# Patient Record
Sex: Male | Born: 1985 | Race: Black or African American | Hispanic: No | Marital: Single | Smoking: Never smoker
Health system: Southern US, Community
[De-identification: ages and names within clinical notes are randomized; demographics above are authoritative.]

## PROBLEM LIST (undated history)

## (undated) DIAGNOSIS — J45909 Unspecified asthma, uncomplicated: Secondary | ICD-10-CM

---
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
## 2009-03-24 ENCOUNTER — Emergency Department (HOSPITAL_COMMUNITY): Admission: EM | Admit: 2009-03-24 | Discharge: 2009-03-24 | Payer: Self-pay | Admitting: Emergency Medicine

## 2009-05-11 ENCOUNTER — Emergency Department (HOSPITAL_COMMUNITY): Admission: EM | Admit: 2009-05-11 | Discharge: 2009-05-11 | Payer: Self-pay | Admitting: Emergency Medicine

## 2009-09-27 ENCOUNTER — Emergency Department (HOSPITAL_COMMUNITY): Admission: EM | Admit: 2009-09-27 | Discharge: 2009-09-27 | Payer: Self-pay | Admitting: Emergency Medicine

## 2010-03-24 ENCOUNTER — Emergency Department (HOSPITAL_COMMUNITY): Admission: EM | Admit: 2010-03-24 | Discharge: 2010-03-24 | Payer: Self-pay | Admitting: Emergency Medicine

## 2010-06-13 ENCOUNTER — Emergency Department (HOSPITAL_COMMUNITY): Admission: EM | Admit: 2010-06-13 | Discharge: 2010-06-13 | Payer: Self-pay | Admitting: Emergency Medicine

## 2010-07-02 ENCOUNTER — Emergency Department (HOSPITAL_COMMUNITY): Admission: EM | Admit: 2010-07-02 | Discharge: 2010-07-02 | Payer: Self-pay | Admitting: Emergency Medicine

## 2010-09-09 ENCOUNTER — Emergency Department (HOSPITAL_COMMUNITY): Admission: EM | Admit: 2010-09-09 | Discharge: 2010-09-09 | Payer: Self-pay | Admitting: Emergency Medicine

## 2010-10-06 ENCOUNTER — Emergency Department (HOSPITAL_COMMUNITY): Admission: EM | Admit: 2010-10-06 | Discharge: 2010-10-06 | Payer: Self-pay | Admitting: Emergency Medicine

## 2010-10-31 ENCOUNTER — Emergency Department (HOSPITAL_COMMUNITY)
Admission: EM | Admit: 2010-10-31 | Discharge: 2010-10-31 | Payer: Self-pay | Source: Home / Self Care | Admitting: Emergency Medicine

## 2010-11-08 ENCOUNTER — Emergency Department (HOSPITAL_COMMUNITY)
Admission: EM | Admit: 2010-11-08 | Discharge: 2010-11-08 | Payer: Self-pay | Source: Home / Self Care | Admitting: Emergency Medicine

## 2010-11-23 ENCOUNTER — Emergency Department (HOSPITAL_COMMUNITY)
Admission: EM | Admit: 2010-11-23 | Discharge: 2010-11-23 | Payer: Self-pay | Source: Home / Self Care | Admitting: Emergency Medicine

## 2011-09-13 ENCOUNTER — Emergency Department (HOSPITAL_COMMUNITY)
Admission: EM | Admit: 2011-09-13 | Discharge: 2011-09-13 | Disposition: A | Discharge status: Home / Self Care | Payer: Self-pay | Attending: Emergency Medicine | Admitting: Emergency Medicine

## 2011-09-13 DIAGNOSIS — Z79899 Other long term (current) drug therapy: Secondary | ICD-10-CM | POA: Insufficient documentation

## 2011-09-13 DIAGNOSIS — M549 Dorsalgia, unspecified: Secondary | ICD-10-CM | POA: Insufficient documentation

## 2012-12-26 ENCOUNTER — Emergency Department (HOSPITAL_COMMUNITY): Payer: Self-pay

## 2012-12-26 ENCOUNTER — Emergency Department (HOSPITAL_COMMUNITY)
Admission: EM | Admit: 2012-12-26 | Discharge: 2012-12-26 | Disposition: A | Discharge status: Home / Self Care | Payer: Self-pay | Attending: Emergency Medicine | Admitting: Emergency Medicine

## 2012-12-26 ENCOUNTER — Encounter (HOSPITAL_COMMUNITY): Payer: Self-pay | Admitting: Emergency Medicine

## 2012-12-26 DIAGNOSIS — J45901 Unspecified asthma with (acute) exacerbation: Secondary | ICD-10-CM | POA: Insufficient documentation

## 2012-12-26 DIAGNOSIS — J45909 Unspecified asthma, uncomplicated: Secondary | ICD-10-CM

## 2012-12-26 HISTORY — DX: Unspecified asthma, uncomplicated: J45.909

## 2012-12-26 MED ORDER — ALBUTEROL SULFATE (5 MG/ML) 0.5% IN NEBU
5.0000 mg | INHALATION_SOLUTION | Freq: Once | RESPIRATORY_TRACT | Status: AC
  Administered 2012-12-26: 5 mg via RESPIRATORY_TRACT
  Filled 2012-12-26: qty 1

## 2012-12-26 MED ORDER — AEROCHAMBER PLUS FLO-VU MEDIUM MISC
1.0000 | Freq: Once | Status: AC
  Administered 2012-12-26: 1
  Filled 2012-12-26 (×2): qty 1

## 2012-12-26 MED ORDER — ALBUTEROL SULFATE HFA 108 (90 BASE) MCG/ACT IN AERS
2.0000 | INHALATION_SPRAY | RESPIRATORY_TRACT | Status: DC | PRN
  Administered 2012-12-26: 2 via RESPIRATORY_TRACT
  Filled 2012-12-26: qty 6.7

## 2012-12-26 NOTE — ED Provider Notes (Signed)
History     CSN: [id number]  Arrival date & time 12/26/12  0917   First MD Initiated Contact with Patient 12/26/12 [phone]      Chief Complaint  Patient presents with  . Shortness of Breath    (Consider location/radiation/quality/duration/timing/severity/associated sxs/prior treatment) HPI Presents with nonproductive cough and wheezing typical of asthma onset 24 hours ago. He was treating himself with albuterol inhaler with partial improvement until he ran out of his inhaler this morning. No fever no other complaint. Symptoms feel like asthma he's had in the past. Past Medical History  Diagnosis Date  . Asthma    Had steroids as child. No recent hospitalization History reviewed. No pertinent past surgical history.  History reviewed. No pertinent family history.  History  Substance Use Topics  . Smoking status: Not on file  . Smokeless tobacco: Not on file  . Alcohol Use: No     nonsmoker no alcohol no drug Review of Systems  Constitutional: Negative.   HENT: Negative.   Respiratory: Positive for cough, shortness of breath and wheezing.   Cardiovascular: Negative.   Gastrointestinal: Negative.   Musculoskeletal: Negative.   Skin: Negative.   Neurological: Negative.   Hematological: Negative.   Psychiatric/Behavioral: Negative.   All other systems reviewed and are negative.    Allergies  Review of patient's allergies indicates not on file.  Home Medications  No current outpatient prescriptions on file.  Pulse 88  Temp 98.5 F (36.9 C) (Oral)  Resp 20  Ht 6\' 6"  (1.981 m)  Wt 170 lb (77.111 kg)  BMI 19.65 kg/m2  SpO2 98%  Physical Exam  Nursing note and vitals reviewed. Constitutional: He appears well-developed and well-nourished.  HENT:  Head: Normocephalic and atraumatic.  Eyes: Conjunctivae normal are normal. Pupils are equal, round, and reactive to light.  Neck: Neck supple. No tracheal deviation present. No thyromegaly present.  Cardiovascular:  Normal rate and regular rhythm.   No murmur heard. Pulmonary/Chest: Effort normal.       Speaks in paragraphs mild end expiratory wheezes no use of accessory muscles  Abdominal: Soft. Bowel sounds are normal. He exhibits no distension. There is no tenderness.  Musculoskeletal: Normal range of motion. He exhibits no edema and no tenderness.  Neurological: He is alert. Coordination normal.  Skin: Skin is warm and dry. No rash noted.  Psychiatric: He has a normal mood and affect.    ED Course  Procedures (including critical care time)  Labs Reviewed - No data to display No results found.   No diagnosis found.  Date: 12/26/2012  Rate: 80  Rhythm: normal sinus rhythm  QRS Axis: normal  Intervals: normal  ST/T Wave abnormalities: early repolarization  Conduction Disutrbances:Rightward IV CD  Narrative Interpretation:   Old EKG Reviewed: none available  No results found for this or any previous visit. Dg Chest 2 View  12/26/2012  *RADIOLOGY REPORT*  Clinical Data: Chest pain and shortness of breath  CHEST - 2 VIEW  Comparison: November 23, 2010  Findings:  Lungs clear.  Heart size and pulmonary vascularity are normal.  No adenopathy.  No bone lesions.  No pneumothorax.  IMPRESSION: No abnormality noted.   Original Report Authenticated By: Bretta Bang, M.D.     Chest x-ray reviewed by me  11:15 AM after one nebulized treatment patient's breathing is normal. He speaks in paragraphs he is no respiratory distress lungs clear auscultation MDM  Plan albuterol inhaler to go with spacer use 2 puffs every 4 hours when necessary  shortness of breath. Referral resource guide to get primary care physician Diagnosis asthmatic bronchitis        Doug Sou, MD 12/26/12 1118

## 2012-12-26 NOTE — ED Notes (Signed)
Pt here for SOB ran out of INHAL sob increased over 2 day with cold weather

## 2013-08-16 ENCOUNTER — Emergency Department: Payer: Self-pay | Admitting: Emergency Medicine

## 2013-09-22 ENCOUNTER — Emergency Department: Payer: Self-pay | Admitting: Emergency Medicine

## 2014-08-03 ENCOUNTER — Emergency Department: Payer: Self-pay | Admitting: Emergency Medicine

## 2015-03-04 ENCOUNTER — Emergency Department (HOSPITAL_COMMUNITY)
Admission: EM | Admit: 2015-03-04 | Discharge: 2015-03-04 | Disposition: A | Discharge status: Home / Self Care | Payer: Self-pay | Attending: Emergency Medicine | Admitting: Emergency Medicine

## 2015-03-04 ENCOUNTER — Encounter (HOSPITAL_COMMUNITY): Payer: Self-pay | Admitting: Emergency Medicine

## 2015-03-04 DIAGNOSIS — R0602 Shortness of breath: Secondary | ICD-10-CM | POA: Insufficient documentation

## 2015-03-04 DIAGNOSIS — R519 Headache, unspecified: Secondary | ICD-10-CM

## 2015-03-04 DIAGNOSIS — R51 Headache: Secondary | ICD-10-CM | POA: Insufficient documentation

## 2015-03-04 DIAGNOSIS — Z791 Long term (current) use of non-steroidal anti-inflammatories (NSAID): Secondary | ICD-10-CM | POA: Insufficient documentation

## 2015-03-04 DIAGNOSIS — Z79899 Other long term (current) drug therapy: Secondary | ICD-10-CM | POA: Insufficient documentation

## 2015-03-04 DIAGNOSIS — Z760 Encounter for issue of repeat prescription: Secondary | ICD-10-CM | POA: Insufficient documentation

## 2015-03-04 DIAGNOSIS — J45901 Unspecified asthma with (acute) exacerbation: Secondary | ICD-10-CM | POA: Insufficient documentation

## 2015-03-04 DIAGNOSIS — R05 Cough: Secondary | ICD-10-CM | POA: Insufficient documentation

## 2015-03-04 MED ORDER — ALBUTEROL SULFATE HFA 108 (90 BASE) MCG/ACT IN AERS: 1.0000 | INHALATION_SPRAY | RESPIRATORY_TRACT | Status: DC | PRN

## 2015-03-04 MED ORDER — PROCHLORPERAZINE MALEATE 10 MG PO TABS
10.0000 mg | ORAL_TABLET | Freq: Once | ORAL | Status: AC
  Administered 2015-03-04: 10 mg via ORAL
  Filled 2015-03-04: qty 1

## 2015-03-04 MED ORDER — PREDNISONE 20 MG PO TABS
60.0000 mg | ORAL_TABLET | Freq: Once | ORAL | Status: AC
  Administered 2015-03-04: 60 mg via ORAL
  Filled 2015-03-04: qty 3

## 2015-03-04 MED ORDER — PREDNISONE 50 MG PO TABS: ORAL_TABLET | ORAL | Status: DC

## 2015-03-04 MED ORDER — BUTALBITAL-APAP-CAFFEINE 50-325-40 MG PO TABS: 1.0000 | ORAL_TABLET | Freq: Four times a day (QID) | ORAL | Status: DC | PRN

## 2015-03-04 MED ORDER — IPRATROPIUM-ALBUTEROL 0.5-2.5 (3) MG/3ML IN SOLN
3.0000 mL | Freq: Once | RESPIRATORY_TRACT | Status: AC
  Administered 2015-03-04: 3 mL via RESPIRATORY_TRACT
  Filled 2015-03-04: qty 3

## 2015-03-04 MED ORDER — ALBUTEROL SULFATE HFA 108 (90 BASE) MCG/ACT IN AERS
2.0000 | INHALATION_SPRAY | Freq: Once | RESPIRATORY_TRACT | Status: AC
  Administered 2015-03-04: 2 via RESPIRATORY_TRACT
  Filled 2015-03-04: qty 6.7

## 2015-03-04 NOTE — ED Notes (Signed)
Pt st's he has a hx of asthma and is out of his inhaler.  Pt c/o cough and shortness of breath onset earlier today.  Pt speaking in full sentences

## 2015-03-04 NOTE — Discharge Instructions (Signed)
Do not hesitate to return to the emergency room for any new, worsening or concerning symptoms.  Please obtain primary care using resource guide below. But the minute you were seen in the emergency room and that they will need to obtain records for further outpatient management.

## 2015-03-04 NOTE — ED Provider Notes (Signed)
CSN: [id number]     Arrival date & time 03/04/15  2003 History  This chart was scribed for non-physician practitioner Wynetta Emery, PA working with Benjiman Core, MD, by Tanda Rockers, ED Scribe. This patient was seen in room TR06C/TR06C and the patient's care was started at 8:38 PM.    Chief Complaint  Patient presents with  . Headache  . Shortness of Breath   The history is provided by the patient. No language interpreter was used.    HPI Comments: Nicholas Braun is a 29 y.o. male with hx asthma who presents to the Emergency Department complaining of headache that began earlier today around 5 PM (3.5 hours ago). Pt states that the pain is generalized. He rates the pain as an 8/10 on the pain scale. Pt states that he has had similar symptoms prior but that it is more severe this time around. Pt took Aleve earlier today with no relief.He mentions that he had nausea and vomiting earlier today prior to the headache beginning. Pt vomited once today but has been eating and drinking normally since.  He also complains of shortness of breath, dry cough, and wheezing. He mentions that his chest is hurting due to the cough. Pt lost his inhaler while moving to the area. He denies ever being hospitalized for his asthma. Pt denies neck pain, fever, or any other symptoms. Pt is not on anti-coagulants at this time.    Past Medical History  Diagnosis Date  . Asthma    History reviewed. No pertinent past surgical history. No family history on file. History  Substance Use Topics  . Smoking status: Never Smoker   . Smokeless tobacco: Not on file  . Alcohol Use: No    Review of Systems  A complete 10 system review of systems was obtained and all systems are negative except as noted in the HPI and PMH.    Allergies  Review of patient's allergies indicates no known allergies.  Home Medications   Prior to Admission medications   Medication Sig Start Date End Date Taking? Authorizing  Provider  albuterol (PROVENTIL HFA;VENTOLIN HFA) 108 (90 BASE) MCG/ACT inhaler Inhale 2 puffs into the lungs every 6 (six) hours as needed.    Historical Provider, MD  naproxen sodium (ANAPROX) 220 MG tablet Take 220 mg by mouth 2 (two) times daily with a meal. pain    Historical Provider, MD   Triage Vitals: BP 114/71 mmHg  Pulse 63  Temp(Src) 98.2 F (36.8 C) (Oral)  Resp 17  Ht  (2.007 m)  Wt 157 lb 9.6 oz (71.487 kg)  BMI 17.75 kg/m2  SpO2 96%   Physical Exam  Constitutional: He is oriented to person, place, and time. He appears well-developed and well-nourished. No distress.  HENT:  Head: Normocephalic and atraumatic.  Mouth/Throat: Oropharynx is clear and moist.  Eyes: Conjunctivae and EOM are normal. Pupils are equal, round, and reactive to light.  Neck: Normal range of motion. Neck supple. No tracheal deviation present.  FROM to C-spine. Pt can touch chin to chest without discomfort. No TTP of midline cervical spine.   Cardiovascular: Normal rate.   Pulmonary/Chest: Effort normal. No stridor. No respiratory distress. He has wheezes. He has no rales. He exhibits no tenderness.  Trace, scattered expiratory wheezing.  Abdominal: Soft.  Musculoskeletal: Normal range of motion.  Neurological: He is alert and oriented to person, place, and time.  II-Visual fields grossly intact. III/IV/VI-Extraocular movements intact.  Pupils reactive bilaterally. V/VII-Smile symmetric, equal  eyebrow raise,  facial sensation intact VIII- Hearing grossly intact IX/X-Normal gag XI-bilateral shoulder shrug XII-midline tongue extension Motor: 5/5 bilaterally with normal tone and bulk Cerebellar: Normal finger-to-nose  and normal heel-to-shin test.   Romberg negative Ambulates with a coordinated gait  Skin: Skin is warm and dry.  Psychiatric: He has a normal mood and affect. His behavior is normal.  Nursing note and vitals reviewed.   ED Course  Procedures (including critical care  time)  DIAGNOSTIC STUDIES: Oxygen Saturation is 96% on RA, normal by my interpretation.    COORDINATION OF CARE: 8:42 PM-Discussed treatment plan which includes albuterol inhaler and nebulizer treatment with pt at bedside and pt agreed to plan.   Labs Review Labs Reviewed - No data to display  Imaging Review No results found.   EKG Interpretation None      MDM   Final diagnoses:  Nonintractable headache, unspecified chronicity pattern, unspecified headache type  Medication refill    Filed Vitals:   03/04/15 2012 03/04/15 2208  BP: 114/71 107/69  Pulse: 63 79  Temp: 98.2 F (36.8 C)   TempSrc: Oral   Resp: 17 16  Height: 6\' 7"  (2.007 m)   Weight: 157 lb 9.6 oz (71.487 kg)   SpO2: 96% 98%    Medications  predniSONE (DELTASONE) tablet 60 mg (60 mg Oral Given 03/04/15 2049)  prochlorperazine (COMPAZINE) tablet 10 mg (10 mg Oral Given 03/04/15 2125)  albuterol (PROVENTIL HFA;VENTOLIN HFA) 108 (90 BASE) MCG/ACT inhaler 2 puff (2 puffs Inhalation Given 03/04/15 2126)  ipratropium-albuterol (DUONEB) 0.5-2.5 (3) MG/3ML nebulizer solution 3 mL (3 mLs Nebulization Given 03/04/15 2050)    Nicholas Braun is a pleasant 29 y.o. male presenting with headache and shortness of breath. Patient recently moved and cannot find his albuterol inhaler. He is saturating well on room air, lung sounds are clear to auscultation, no fever or productive cough to suggest pneumonia. HA. Presentation is like pts typical HA and non concerning for [facility], ICH, Meningitis, or temporal arteritis. Pt is afebrile with no focal neuro deficits, nuchal rigidity, or change in vision. Offered patient IV and headache cocktail and patient declines, he feels improved after Compazine, prednisone and DuoNeb. Patient will be given albuterol inhaler in the ED. Primary care referral and return precautions given.  Evaluation does not show pathology that would require ongoing emergent intervention or inpatient treatment. Pt is  hemodynamically stable and mentating appropriately. Discussed findings and plan with patient/guardian, who agrees with care plan. All questions answered. Return precautions discussed and outpatient follow up given.   New Prescriptions   ALBUTEROL (PROVENTIL HFA;VENTOLIN HFA) 108 (90 BASE) MCG/ACT INHALER    Inhale 1-2 puffs into the lungs every 4 (four) hours as needed for wheezing or shortness of breath.   BUTALBITAL-ACETAMINOPHEN-CAFFEINE (FIORICET) 50-325-40 MG PER TABLET    Take 1 tablet by mouth every 6 (six) hours as needed for headache or migraine.   PREDNISONE (DELTASONE) 50 MG TABLET    Take 1 tablet daily with breakfast     I personally performed the services described in this documentation, which was scribed in my presence. The recorded information has been reviewed and is accurate.      Wynetta Emeryicole Baylea Milburn, PA-C 03/05/15 [id number]  Benjiman CoreNathan Pickering, MD 03/08/15 [phone]

## 2015-06-15 ENCOUNTER — Emergency Department (HOSPITAL_COMMUNITY)
Admission: EM | Admit: 2015-06-15 | Discharge: 2015-06-16 | Disposition: A | Discharge status: Home / Self Care | Payer: Self-pay | Attending: Emergency Medicine | Admitting: Emergency Medicine

## 2015-06-15 ENCOUNTER — Encounter (HOSPITAL_COMMUNITY): Payer: Self-pay | Admitting: *Deleted

## 2015-06-15 DIAGNOSIS — Z791 Long term (current) use of non-steroidal anti-inflammatories (NSAID): Secondary | ICD-10-CM | POA: Insufficient documentation

## 2015-06-15 DIAGNOSIS — J45901 Unspecified asthma with (acute) exacerbation: Secondary | ICD-10-CM | POA: Insufficient documentation

## 2015-06-15 MED ORDER — LORATADINE 10 MG PO TABS: 10.0000 mg | ORAL_TABLET | Freq: Every day | ORAL | Status: DC

## 2015-06-15 MED ORDER — PREDNISONE 20 MG PO TABS
60.0000 mg | ORAL_TABLET | Freq: Once | ORAL | Status: AC
  Administered 2015-06-15: 60 mg via ORAL
  Filled 2015-06-15: qty 3

## 2015-06-15 MED ORDER — PREDNISONE 50 MG PO TABS: ORAL_TABLET | ORAL | Status: DC

## 2015-06-15 MED ORDER — ALBUTEROL SULFATE (2.5 MG/3ML) 0.083% IN NEBU
5.0000 mg | INHALATION_SOLUTION | Freq: Once | RESPIRATORY_TRACT | Status: AC
  Administered 2015-06-15: 5 mg via RESPIRATORY_TRACT
  Filled 2015-06-15: qty 6

## 2015-06-15 MED ORDER — ALBUTEROL SULFATE HFA 108 (90 BASE) MCG/ACT IN AERS
2.0000 | INHALATION_SPRAY | Freq: Once | RESPIRATORY_TRACT | Status: AC
  Administered 2015-06-15: 2 via RESPIRATORY_TRACT
  Filled 2015-06-15: qty 6.7

## 2015-06-15 MED ORDER — IPRATROPIUM BROMIDE 0.02 % IN SOLN
0.5000 mg | Freq: Once | RESPIRATORY_TRACT | Status: AC
  Administered 2015-06-15: 0.5 mg via RESPIRATORY_TRACT
  Filled 2015-06-15: qty 2.5

## 2015-06-15 NOTE — ED Provider Notes (Signed)
CSN: [id number]     Arrival date & time 06/15/15  2203 History  This chart was scribed for non-physician practitioner, Nicholas MaduraKelly Majesty Stehlin, PA-C working with Nicholas MochaBlair Walden, MD by Nicholas Braun, ED scribe. This patient was seen in room WTR9/WTR9 and the patient's care was started at 10:43 PM.   Chief Complaint  Patient presents with  . Shortness of Breath   The history is provided by the patient. No language interpreter was used.   HPI Comments: Nicholas Braun is a 29 y.o. male, with a history of asthma, who presents to the Emergency Department complaining of intermittent, moderate, SOB with onset 3 days ago. He notes that he experiences mild SOB and an associated, intermittent, mild, unproductive cough when waking in the morning that progressively worsens throughout the day due to the heat. He notes taking albuterol for his asthma and benadryl for his chronic seasonal allergies as needed. 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 He denies fever, chills, LOC or any history of smoking.     Past Medical History  Diagnosis Date  . Asthma    History reviewed. No pertinent past surgical history. No family history on file. History  Substance Use Topics  . Smoking status: Never Smoker   . Smokeless tobacco: Not on file  . Alcohol Use: No    Review of Systems  Respiratory: Positive for cough and shortness of breath.   All other systems reviewed and are negative.   Allergies  Review of patient's allergies indicates no known allergies.  Home Medications   Prior to Admission medications   Medication Sig Start Date End Date Taking? Authorizing Provider  naproxen sodium (ANAPROX) 220 MG tablet Take 220 mg by mouth 2 (two) times daily with a meal. pain   Yes Historical Provider, MD  albuterol (PROVENTIL HFA;VENTOLIN HFA) 108 (90 BASE) MCG/ACT inhaler Inhale 1-2 puffs into the lungs every 4 (four) hours as needed for wheezing or shortness of breath. Patient not taking: Reported on 06/15/2015  03/04/15   Joni ReiningNicole Pisciotta, PA-C  butalbital-acetaminophen-caffeine (FIORICET) 50-325-40 MG per tablet Take 1 tablet by mouth every 6 (six) hours as needed for headache or migraine. Patient not taking: Reported on 06/15/2015 03/04/15   Joni ReiningNicole Pisciotta, PA-C  loratadine (CLARITIN) 10 MG tablet Take 1 tablet (10 mg total) by mouth daily. 06/15/15   Nicholas MaduraKelly Gunhild Bautch, PA-C  predniSONE (DELTASONE) 50 MG tablet Take 1 tablet daily with breakfast 06/15/15   Nicholas MaduraKelly Eboney Claybrook, PA-C   BP 106/58 mmHg  Pulse 48  Temp(Src) 98.1 F (36.7 C) (Oral)  Resp 16  SpO2 100%   Physical Exam  Constitutional: He is oriented to person, place, and time. He appears well-developed and well-nourished. No distress.  HENT:  Head: Normocephalic and atraumatic.  Eyes: Conjunctivae and EOM are normal. No scleral icterus.  Neck: Normal range of motion.  Cardiovascular: Normal rate, regular rhythm and intact distal pulses.   Pulmonary/Chest: Effort normal. No respiratory distress. He has wheezes. He has no rales.  Mild inspiratory and expiratory wheezes. No accessory muscle use or retractions.  Musculoskeletal: Normal range of motion.  Neurological: He is alert and oriented to person, place, and time. He exhibits normal muscle tone. Coordination normal.  Skin: Skin is warm and dry. No rash noted. He is not diaphoretic. No erythema. No pallor.  Psychiatric: He has a normal mood and affect. His behavior is normal.  Nursing note and vitals reviewed.   ED Course  Procedures  DIAGNOSTIC STUDIES: Oxygen Saturation is 100% on RA, normal  by my interpretation.    COORDINATION OF CARE: 10:46 PM Discussed treatment plan with pt at bedside and pt agreed to plan.  Labs Review Labs Reviewed - No data to display  Imaging Review No results found.   EKG Interpretation None      MDM   Final diagnoses:  Asthma exacerbation    29 year old male presents to the emergency department for shortness of breath 3 days.Patient reports a  history of asthma and states that he has been out of his albuterol inhaler. Patient with mild inspiratory and expiratory wheezing on exam. This resolved after 1 DuoNeb treatment and oral sterile. Patient is afebrile. No tachypnea, dyspnea, or hypoxia. Doubt pneumonia. Will discharge at this time with albuterol inhaler as well as a course of Claritin and prednisone. Return precautions discussed and provided. Patient agreeable to plan with no unaddressed concerns. Patient discharged in good condition.  I personally performed the services described in this documentation, which was scribed in my presence. The recorded information has been reviewed and is accurate.     Nicholas Madura, PA-C 06/15/15 2348  Nicholas Mocha, MD 06/16/15 0000

## 2015-06-15 NOTE — Discharge Instructions (Signed)

## 2015-06-15 NOTE — ED Notes (Signed)
Pt states he has been having shortness of breath for the past 3 days. Pt states he has a history of asthma.

## 2015-08-08 ENCOUNTER — Encounter (HOSPITAL_COMMUNITY): Payer: Self-pay | Admitting: Family Medicine

## 2015-08-08 ENCOUNTER — Emergency Department (HOSPITAL_COMMUNITY)
Admission: EM | Admit: 2015-08-08 | Discharge: 2015-08-09 | Disposition: A | Discharge status: Home / Self Care | Payer: Self-pay | Attending: Emergency Medicine | Admitting: Emergency Medicine

## 2015-08-08 ENCOUNTER — Emergency Department (HOSPITAL_COMMUNITY): Payer: Self-pay

## 2015-08-08 DIAGNOSIS — J45901 Unspecified asthma with (acute) exacerbation: Secondary | ICD-10-CM | POA: Insufficient documentation

## 2015-08-08 DIAGNOSIS — Z791 Long term (current) use of non-steroidal anti-inflammatories (NSAID): Secondary | ICD-10-CM | POA: Insufficient documentation

## 2015-08-08 LAB — BASIC METABOLIC PANEL
Anion gap: 6 (ref 5–15)
BUN: 8 mg/dL (ref 6–20)
CO2: 28 mmol/L (ref 22–32)
CREATININE: 1.23 mg/dL (ref 0.61–1.24)
Calcium: 8.3 mg/dL — ABNORMAL LOW (ref 8.9–10.3)
Chloride: 107 mmol/L (ref 101–111)
GFR calc Af Amer: >60 mL/min (ref >60)
GLUCOSE: 99 mg/dL (ref 65–99)
POTASSIUM: 3.7 mmol/L (ref 3.5–5.1)
Sodium: 141 mmol/L (ref 135–145)

## 2015-08-08 LAB — CBC
HEMATOCRIT: 41.7 % (ref 39.0–52.0)
Hemoglobin: 13.7 g/dL (ref 13.0–17.0)
MCH: 29.4 pg (ref 26.0–34.0)
MCHC: 32.9 g/dL (ref 30.0–36.0)
MCV: 89.5 fL (ref 78.0–100.0)
Platelets: 224 10*3/uL (ref 150–400)
RBC: 4.66 MIL/uL (ref 4.22–5.81)
RDW: 12.4 % (ref 11.5–15.5)
WBC: 4.9 10*3/uL (ref 4.0–10.5)

## 2015-08-08 LAB — I-STAT TROPONIN, ED: Troponin i, poc: 0 ng/mL (ref 0.00–0.08)

## 2015-08-08 MED ORDER — PREDNISONE 20 MG PO TABS
60.0000 mg | ORAL_TABLET | Freq: Once | ORAL | Status: AC
  Administered 2015-08-08: 60 mg via ORAL
  Filled 2015-08-08: qty 3

## 2015-08-08 MED ORDER — ALBUTEROL SULFATE (2.5 MG/3ML) 0.083% IN NEBU
5.0000 mg | INHALATION_SOLUTION | RESPIRATORY_TRACT | Status: AC
  Administered 2015-08-08: 5 mg via RESPIRATORY_TRACT
  Filled 2015-08-08: qty 6

## 2015-08-08 MED ORDER — IPRATROPIUM BROMIDE 0.02 % IN SOLN
0.5000 mg | Freq: Once | RESPIRATORY_TRACT | Status: AC
  Administered 2015-08-08: 0.5 mg via RESPIRATORY_TRACT
  Filled 2015-08-08: qty 2.5

## 2015-08-08 NOTE — ED Notes (Signed)
NURSE AT BEDSIDE WILL GET BLOOD

## 2015-08-08 NOTE — ED Notes (Signed)
Patients states has been experiencing shortness of breath for the last two days but got worse today. Today, he developed mid-sternal chest pain.

## 2015-08-08 NOTE — ED Provider Notes (Signed)
CSN: [id number]     Arrival date & time 08/08/15  2136 History  This chart was scribed for Derwood Kaplan, MD by Ronney Lion, ED Scribe. This patient was seen in room WA14/WA14 and the patient's care was started at 11:20 PM.    Chief Complaint  Patient presents with  . Shortness of Breath  . Chest Pain   The history is provided by the patient. No language interpreter was used.    HPI Comments: Nicholas Braun is a 29 y.o. male with a history of asthma, who presents to the Emergency Department complaining of moderate, pressure-like chest pain when breathing, with onset 2 days ago and worsening today. He also complains of an associated cough productive with green sputum, SOB, and wheezing. He reports a history of seasonal allergies and states changes in weather sometimes exacerbate his asthma symptoms. Patient states he is not currently followed by a PCP for his asthma due to insurance issues. He states he is otherwise healthy. Patient denies a history of smoking. He denies any nasal congestion or sinus congestion.  Past Medical History  Diagnosis Date  . Asthma    History reviewed. No pertinent past surgical history. History reviewed. No pertinent family history. Social History  Substance Use Topics  . Smoking status: Never Smoker   . Smokeless tobacco: None  . Alcohol Use: No    Review of Systems  HENT: Negative for congestion and sinus pressure.   Respiratory: Positive for shortness of breath.   Cardiovascular: Positive for chest pain (when breathing).  All other systems reviewed and are negative.  Allergies  Review of patient's allergies indicates no known allergies.  Home Medications   Prior to Admission medications   Medication Sig Start Date End Date Taking? Authorizing Provider  albuterol (PROVENTIL HFA;VENTOLIN HFA) 108 (90 BASE) MCG/ACT inhaler Inhale 1-2 puffs into the lungs every 4 (four) hours as needed for wheezing or shortness of breath. Patient not taking:  Reported on 06/15/2015 03/04/15   [name], PA-C  butalbital-acetaminophen-caffeine (FIORICET) 50-325-40 MG per tablet Take 1 tablet by mouth every 6 (six) hours as needed for headache or migraine. Patient not taking: Reported on 06/15/2015 03/04/15   [name], PA-C  loratadine (CLARITIN) 10 MG tablet Take 1 tablet (10 mg total) by mouth daily. Patient not taking: Reported on 08/08/2015 06/15/15   [name], PA-C  naproxen sodium (ANAPROX) 220 MG tablet Take 220 mg by mouth 2 (two) times daily with a meal. pain    Historical Provider, MD  predniSONE (DELTASONE) 50 MG tablet Take 1 tablet daily with breakfast Patient not taking: Reported on 08/08/2015 06/15/15   [name], PA-C   BP 116/68 mmHg  Pulse 54  Temp(Src) 98.5 F (36.9 C) (Oral)  Resp 16  Ht  (2.007 m)  Wt 210 lb (95.255 kg)  BMI 23.65 kg/m2  SpO2 97% Physical Exam  Constitutional: He is oriented to person, place, and time. He appears well-developed and well-nourished. No distress.  HENT:  Head: Normocephalic and atraumatic.  Eyes: Conjunctivae and EOM are normal.  Neck: Neck supple. No JVD present. No tracheal deviation present.  Cardiovascular: Normal rate, regular rhythm and normal heart sounds.   Pulses:      Radial pulses are 2+ on the right side, and 2+ on the left side.  2+ equal radial pulse. No JVD. No pitting edema or unilateral calf swelling.   Pulmonary/Chest: Effort normal. No respiratory distress. He has wheezes.  Diffuse expiratory wheezing  Musculoskeletal: Normal  range of motion. He exhibits no edema.  Lymphadenopathy:    He has no cervical adenopathy.  Neurological: He is alert and oriented to person, place, and time.  Skin: Skin is warm and dry.  Psychiatric: He has a normal mood and affect. His behavior is normal.  Nursing note and vitals reviewed.   ED Course  Procedures (including critical care time)  DIAGNOSTIC STUDIES: Oxygen Saturation is 97% on RA, normal by my  interpretation.    COORDINATION OF CARE: 11:23 PM - Discussed treatment plan with pt at bedside which includes breathing treatment and CXR. Pt verbalized understanding and agreed to plan.   2:22 AM - Lungs cleared. Albuterol inhaler to be given in the ER along with decadron.  Labs Review Labs Reviewed  BASIC METABOLIC PANEL - Abnormal; Notable for the following:    Calcium 8.3 (*)    All other components within normal limits  CBC  I-STAT TROPOININ, ED    Imaging Review Dg Chest 2 View  08/08/2015   CLINICAL DATA:  Shortness of breath for 2 days. Worse today after playing basketball. Now with midsternal chest pain.  EXAM: CHEST  2 VIEW  COMPARISON:  09/22/2013  FINDINGS: Unchanged hyperinflation. The cardiomediastinal contours are normal. The lungs are clear. Pulmonary vasculature is normal. No consolidation, pleural effusion, or pneumothorax. No acute osseous abnormalities are seen.  IMPRESSION: Unchanged hyperinflation, can be seen with reactive airways disease. No localizing or acute process.   Electronically Signed   By: Rubye Oaks M.D.   On: 08/08/2015 23:20   I have personally reviewed and evaluated these images and lab results as part of my medical decision-making.   EKG Interpretation   Date/Time:  Sunday August 08 2015 21:55:24 EDT Ventricular Rate:  57 PR Interval:  180 QRS Duration: 90 QT Interval:  405 QTC Calculation: 394 R Axis:   65 Text Interpretation:  Sinus rhythm RSR' in V1 or V2, probably normal  variant Inferior infarct, acute (LCx) ST elevation, consider anterior  injury No significant change since last tracing Confirmed by Rhunette Croft, MD,  Janey Genta [phone]) on 08/08/2015 11:25:03 PM      MDM   Final diagnoses:  Asthma exacerbation    I personally performed the services described in this documentation, which was scribed in my presence. The recorded information has been reviewed and is accurate.   Pt comes in with cc of dib. He has expiratory  wheezing, end. No treatments at home. We gave him an hour long tx, and his wheezing has cleared. Will d.c    Derwood Kaplan, MD 08/09/15 [phone]

## 2015-08-09 MED ORDER — DEXAMETHASONE SODIUM PHOSPHATE 10 MG/ML IJ SOLN
10.0000 mg | Freq: Once | INTRAMUSCULAR | Status: AC
  Administered 2015-08-09: 10 mg via INTRAMUSCULAR
  Filled 2015-08-09: qty 1

## 2015-08-09 MED ORDER — ALBUTEROL SULFATE HFA 108 (90 BASE) MCG/ACT IN AERS
1.0000 | INHALATION_SPRAY | Freq: Once | RESPIRATORY_TRACT | Status: AC
  Administered 2015-08-09: 1 via RESPIRATORY_TRACT
  Filled 2015-08-09: qty 6.7

## 2015-08-09 NOTE — Discharge Instructions (Signed)
We saw you in the ER for your asthma related complains. We gave you some breathing treatments in the ER, and seems like your symptoms have improved. Please take albuterol as needed every 4 hours. Please take the medications prescribed. Please refrain from smoking or smoke exposure. Please see a primary care doctor in 1 week. Return to the ER if your symptoms worsen.   Bronchospasm A bronchospasm is a spasm or tightening of the airways going into the lungs. During a bronchospasm breathing becomes more difficult because the airways get smaller. When this happens there can be coughing, a whistling sound when breathing (wheezing), and difficulty breathing. Bronchospasm is often associated with asthma, but not all patients who experience a bronchospasm have asthma. CAUSES  A bronchospasm is caused by inflammation or irritation of the airways. The inflammation or irritation may be triggered by:   Allergies (such as to animals, pollen, food, or mold). Allergens that cause bronchospasm may cause wheezing immediately after exposure or many hours later.   Infection. Viral infections are believed to be the most common cause of bronchospasm.   Exercise.   Irritants (such as pollution, cigarette smoke, strong odors, aerosol sprays, and paint fumes).   Weather changes. Winds increase molds and pollens in the air. Rain refreshes the air by washing irritants out. Cold air may cause inflammation.   Stress and emotional upset.  SIGNS AND SYMPTOMS   Wheezing.   Excessive nighttime coughing.   Frequent or severe coughing with a simple cold.   Chest tightness.   Shortness of breath.  DIAGNOSIS  Bronchospasm is usually diagnosed through a history and physical exam. Tests, such as chest X-rays, are sometimes done to look for other conditions. TREATMENT   Inhaled medicines can be given to open up your airways and help you breathe. The medicines can be given using either an inhaler or a  nebulizer machine.  Corticosteroid medicines may be given for severe bronchospasm, usually when it is associated with asthma. HOME CARE INSTRUCTIONS   Always have a plan prepared for seeking medical care. Know when to call your health care provider and local emergency services (911 in the [place] Know where you can access local emergency care.  Only take medicines as directed by your health care provider.  If you were prescribed an inhaler or nebulizer machine, ask your health care provider to explain how to use it correctly. Always use a spacer with your inhaler if you were given one.  It is necessary to remain calm during an attack. Try to relax and breathe more slowly.  Control your home environment in the following ways:   Change your heating and air conditioning filter at least once a month.   Limit your use of fireplaces and wood stoves.  Do not smoke and do not allow smoking in your home.   Avoid exposure to perfumes and fragrances.   Get rid of pests (such as roaches and mice) and their droppings.   Throw away plants if you see mold on them.   Keep your house clean and dust free.   Replace carpet with wood, tile, or vinyl flooring. Carpet can trap dander and dust.   Use allergy-proof pillows, mattress covers, and box spring covers.   Wash bed sheets and blankets every week in hot water and dry them in a dryer.   Use blankets that are made of polyester or cotton.   Wash hands frequently. SEEK MEDICAL CARE IF:   You have muscle aches.   You  have chest pain.   The sputum changes from clear or white to yellow, green, gray, or bloody.   The sputum you cough up gets thicker.   There are problems that may be related to the medicine you are given, such as a rash, itching, swelling, or trouble breathing.  SEEK IMMEDIATE MEDICAL CARE IF:   You have worsening wheezing and coughing even after taking your prescribed medicines.   You have increased  difficulty breathing.   You develop severe chest pain. MAKE SURE YOU:   Understand these instructions.  Will watch your condition.  Will get help right away if you are not doing well or get worse. Document Released: 11/16/2003 Document Revised: 11/18/2013 Document Reviewed: 05/05/2013 [facility] Patient Information 2015 [place], [place]. This information is not intended to replace advice given to you by your health care provider. Make sure you discuss any questions you have with your health care provider.

## 2015-10-08 ENCOUNTER — Encounter (HOSPITAL_COMMUNITY): Payer: Self-pay | Admitting: Emergency Medicine

## 2015-10-08 ENCOUNTER — Emergency Department (HOSPITAL_COMMUNITY)
Admission: EM | Admit: 2015-10-08 | Discharge: 2015-10-08 | Disposition: A | Discharge status: Home / Self Care | Payer: Self-pay | Attending: Emergency Medicine | Admitting: Emergency Medicine

## 2015-10-08 DIAGNOSIS — J45901 Unspecified asthma with (acute) exacerbation: Secondary | ICD-10-CM | POA: Insufficient documentation

## 2015-10-08 DIAGNOSIS — J45909 Unspecified asthma, uncomplicated: Secondary | ICD-10-CM

## 2015-10-08 MED ORDER — ALBUTEROL SULFATE HFA 108 (90 BASE) MCG/ACT IN AERS
2.0000 | INHALATION_SPRAY | Freq: Once | RESPIRATORY_TRACT | Status: DC
  Filled 2015-10-08: qty 6.7

## 2015-10-08 MED ORDER — ALBUTEROL SULFATE (2.5 MG/3ML) 0.083% IN NEBU
5.0000 mg | INHALATION_SOLUTION | Freq: Once | RESPIRATORY_TRACT | Status: AC
  Administered 2015-10-08: 5 mg via RESPIRATORY_TRACT
  Filled 2015-10-08: qty 6

## 2015-10-08 MED ORDER — ALBUTEROL SULFATE HFA 108 (90 BASE) MCG/ACT IN AERS
1.0000 | INHALATION_SPRAY | RESPIRATORY_TRACT | Status: DC | PRN
Start: 2015-10-08 — End: 2016-01-24

## 2015-10-08 NOTE — Discharge Instructions (Signed)
Asthma, Adult Asthma is a condition of the lungs in which the airways tighten and narrow. Asthma can make it hard to breathe. Asthma cannot be cured, but medicine and lifestyle changes can help control it. Asthma may be started (triggered) by:  Animal skin flakes (dander).  Dust.  Cockroaches.  Pollen.  Mold.  Smoke.  Cleaning products.  Hair sprays or aerosol sprays.  Paint fumes or strong smells.  Cold air, weather changes, and winds.  Crying or laughing hard.  Stress.  Certain medicines or drugs.  Foods, such as dried fruit, potato chips, and sparkling grape juice.  Infections or conditions (colds, flu).  Exercise.  Certain medical conditions or diseases.  Exercise or tiring activities. HOME CARE   Take medicine as told by your doctor.  Use a peak flow meter as told by your doctor. A peak flow meter is a tool that measures how well the lungs are working.  Record and keep track of the peak flow meter's readings.  Understand and use the asthma action plan. An asthma action plan is a written plan for taking care of your asthma and treating your attacks.  To help prevent asthma attacks:  Do not smoke. Stay away from secondhand smoke.  Change your heating and air conditioning filter often.  Limit your use of fireplaces and wood stoves.  Get rid of pests (such as roaches and mice) and their droppings.  Throw away plants if you see mold on them.  Clean your floors. Dust regularly. Use cleaning products that do not smell.  Have someone vacuum when you are not home. Use a vacuum cleaner with a HEPA filter if possible.  Replace carpet with wood, tile, or vinyl flooring. Carpet can trap animal skin flakes and dust.  Use allergy-proof pillows, mattress covers, and box spring covers.  Wash bed sheets and blankets every week in hot water and dry them in a dryer.  Use blankets that are made of polyester or cotton.  Clean bathrooms and kitchens with bleach.  If possible, have someone repaint the walls in these rooms with mold-resistant paint. Keep out of the rooms that are being cleaned and painted.  Wash hands often. GET HELP IF:  You have make a whistling sound when breaking (wheeze), have shortness of breath, or have a cough even if taking medicine to prevent attacks.  The colored mucus you cough up (sputum) is thicker than usual.  The colored mucus you cough up changes from clear or white to yellow, green, gray, or bloody.  You have problems from the medicine you are taking such as:  A rash.  Itching.  Swelling.  Trouble breathing.  You need reliever medicines more than 2-3 times a week.  Your peak flow measurement is still at 50-79% of your personal best after following the action plan for 1 hour.  You have a fever. GET HELP RIGHT AWAY IF:   You seem to be worse and are not responding to medicine during an asthma attack.  You are short of breath even at rest.  You get short of breath when doing very little activity.  You have trouble eating, drinking, or talking.  You have chest pain.  You have a fast heartbeat.  Your lips or fingernails start to turn blue.  You are light-headed, dizzy, or faint.  Your peak flow is less than 50% of your personal best.   This information is not intended to replace advice given to you by your health care provider. Make sure   you discuss any questions you have with your health care provider.   Document Released: 05/01/2008 Document Revised: 08/04/2015 Document Reviewed: 06/12/2013 Elsevier Interactive Patient Education 2016 [employer]vier Inc.  

## 2015-10-08 NOTE — ED Notes (Signed)
Pt from home c/o shortness of breath mainly at night while sleeping. Hx of asthma. Mild inspiratory wheezing/.Pt laughing and talking in full sentences in triage. Pt out of his inhaler

## 2015-10-08 NOTE — ED Provider Notes (Signed)
CSN: [id number]     Arrival date & time 10/08/15  2128 History   First MD Initiated Contact with Patient 10/08/15 2225     Chief Complaint  Patient presents with  . Asthma     (Consider location/radiation/quality/duration/timing/severity/associated sxs/prior Treatment) The history is provided by the patient.   Patient is a 29 year old with history of asthma who presents with 2 days of shortness of breath. Patient reports that he has been becoming short of breath at night for the past 2 nights. His shortness of breath usually responds to his albuterol inhaler, however he ran out of his inhaler yesterday. Patient does not have any symptoms in the morning. Denies any current shortness of breath. No wheezing. No URI symptoms. No sick contacts. No fevers or chills. Reports that his shortness of breath usually occurs with weather changes. Patient does not currently have a PCP due to insurance issues.   Past Medical History  Diagnosis Date  . Asthma    History reviewed. No pertinent past surgical history. No family history on file. Social History  Substance Use Topics  . Smoking status: Never Smoker   . Smokeless tobacco: None  . Alcohol Use: No    Review of Systems  Constitutional: Negative.   HENT: Negative.   Eyes: Negative.   Respiratory: Positive for shortness of breath. Negative for wheezing.   Cardiovascular: Negative.   Gastrointestinal: Negative.   Endocrine: Negative.   Genitourinary: Negative.   Musculoskeletal: Negative.   Skin: Negative.   Allergic/Immunologic: Negative.   Neurological: Negative.   Hematological: Negative.   Psychiatric/Behavioral: Negative.       Allergies  Review of patient's allergies indicates no known allergies.  Home Medications   Prior to Admission medications   Medication Sig Start Date End Date Taking? Authorizing Provider  albuterol (PROVENTIL HFA;VENTOLIN HFA) 108 (90 BASE) MCG/ACT inhaler Inhale 1-2 puffs into the lungs every 4  (four) hours as needed for wheezing or shortness of breath. 10/08/15   Ardith Darkaleb M Doyce Saling, MD  butalbital-acetaminophen-caffeine (FIORICET) 585014355150-325-40 MG per tablet Take 1 tablet by mouth every 6 (six) hours as needed for headache or migraine. Patient not taking: Reported on 06/15/2015 03/04/15   Joni ReiningNicole Pisciotta, PA-C  loratadine (CLARITIN) 10 MG tablet Take 1 tablet (10 mg total) by mouth daily. Patient not taking: Reported on 08/08/2015 06/15/15   Antony MaduraKelly Humes, PA-C  predniSONE (DELTASONE) 50 MG tablet Take 1 tablet daily with breakfast Patient not taking: Reported on 08/08/2015 06/15/15   Antony MaduraKelly Humes, PA-C   BP 112/59 mmHg  Pulse 68  Temp(Src) 97.7 F (36.5 C) (Oral)  Resp 16  SpO2 98% Physical Exam  Constitutional: He is oriented to person, place, and time. He appears well-developed and well-nourished. No distress.  HENT:  Head: Normocephalic and atraumatic.  Eyes: EOM are normal. Pupils are equal, round, and reactive to light.  Neck: Normal range of motion.  Cardiovascular: Normal rate, regular rhythm and normal heart sounds.   No murmur heard. Pulmonary/Chest: Effort normal and breath sounds normal. No respiratory distress. He has no wheezes.  Abdominal: Soft. Bowel sounds are normal. He exhibits no distension. There is no tenderness.  Musculoskeletal: Normal range of motion. He exhibits no edema.  Neurological: He is alert and oriented to person, place, and time. No cranial nerve deficit.  Skin: Skin is warm and dry.  Psychiatric: He has a normal mood and affect. His behavior is normal.  Nursing note and vitals reviewed.   ED Course  Procedures (including  critical care time) Labs Review Labs Reviewed - No data to display  Imaging Review No results found. I have personally reviewed and evaluated these images and lab results as part of my medical decision-making.  MDM   Final diagnoses:  Asthma, unspecified asthma severity, uncomplicated    10:40 PM Patient is a 29 year old  man with history of asthma presenting with occasional shortness of breath at night for the past 2 days after running out of his albuterol inhaler. Currently stable on room air. Received a nebulizer treatment in the ED. No current wheezing or shortness of breath. No evidence for pneumonia. Will give 2 puffs of albuterol inhaler and discharge home with prescription for inhaler.     Ardith Dark, MD 10/08/15 8295  Derwood Kaplan, MD 10/09/15 [phone]

## 2015-11-11 ENCOUNTER — Encounter (HOSPITAL_COMMUNITY): Payer: Self-pay

## 2015-11-11 ENCOUNTER — Telehealth (HOSPITAL_BASED_OUTPATIENT_CLINIC_OR_DEPARTMENT_OTHER): Payer: Self-pay | Admitting: Emergency Medicine

## 2015-11-11 ENCOUNTER — Emergency Department (HOSPITAL_COMMUNITY): Payer: Self-pay

## 2015-11-11 ENCOUNTER — Emergency Department (HOSPITAL_COMMUNITY)
Admission: EM | Admit: 2015-11-11 | Discharge: 2015-11-11 | Disposition: A | Discharge status: Home / Self Care | Payer: Self-pay | Attending: Emergency Medicine | Admitting: Emergency Medicine

## 2015-11-11 DIAGNOSIS — E162 Hypoglycemia, unspecified: Secondary | ICD-10-CM | POA: Insufficient documentation

## 2015-11-11 DIAGNOSIS — Z79899 Other long term (current) drug therapy: Secondary | ICD-10-CM | POA: Insufficient documentation

## 2015-11-11 DIAGNOSIS — R109 Unspecified abdominal pain: Secondary | ICD-10-CM

## 2015-11-11 DIAGNOSIS — K5900 Constipation, unspecified: Secondary | ICD-10-CM | POA: Insufficient documentation

## 2015-11-11 DIAGNOSIS — R0789 Other chest pain: Secondary | ICD-10-CM | POA: Insufficient documentation

## 2015-11-11 DIAGNOSIS — J45909 Unspecified asthma, uncomplicated: Secondary | ICD-10-CM | POA: Insufficient documentation

## 2015-11-11 DIAGNOSIS — R252 Cramp and spasm: Secondary | ICD-10-CM | POA: Insufficient documentation

## 2015-11-11 LAB — URINALYSIS, ROUTINE W REFLEX MICROSCOPIC
BILIRUBIN URINE: NEGATIVE
GLUCOSE, UA: NEGATIVE mg/dL
Hgb urine dipstick: NEGATIVE
KETONES UR: NEGATIVE mg/dL
Leukocytes, UA: NEGATIVE
NITRITE: NEGATIVE
PH: 6 (ref 5.0–8.0)
Protein, ur: 30 mg/dL — AB
SPECIFIC GRAVITY, URINE: 1.019 (ref 1.005–1.030)

## 2015-11-11 LAB — COMPREHENSIVE METABOLIC PANEL
ALT: 11 U/L — ABNORMAL LOW (ref 17–63)
ANION GAP: 9 (ref 5–15)
AST: 22 U/L (ref 15–41)
Albumin: 4.7 g/dL (ref 3.5–5.0)
Alkaline Phosphatase: 58 U/L (ref 38–126)
BUN: 14 mg/dL (ref 6–20)
CHLORIDE: 104 mmol/L (ref 101–111)
CO2: 28 mmol/L (ref 22–32)
Calcium: 9.3 mg/dL (ref 8.9–10.3)
Creatinine, Ser: 1.53 mg/dL — ABNORMAL HIGH (ref 0.61–1.24)
GFR calc non Af Amer: 60 mL/min — ABNORMAL LOW (ref >60)
GLUCOSE: 56 mg/dL — AB (ref 65–99)
Potassium: 3.7 mmol/L (ref 3.5–5.1)
SODIUM: 141 mmol/L (ref 135–145)
TOTAL PROTEIN: 7.4 g/dL (ref 6.5–8.1)
Total Bilirubin: 1.7 mg/dL — ABNORMAL HIGH (ref 0.3–1.2)

## 2015-11-11 LAB — CBC
HEMATOCRIT: 41.4 % (ref 39.0–52.0)
HEMOGLOBIN: 13.6 g/dL (ref 13.0–17.0)
MCH: 29.7 pg (ref 26.0–34.0)
MCHC: 32.9 g/dL (ref 30.0–36.0)
MCV: 90.4 fL (ref 78.0–100.0)
Platelets: 309 10*3/uL (ref 150–400)
RBC: 4.58 MIL/uL (ref 4.22–5.81)
RDW: 12.7 % (ref 11.5–15.5)
WBC: 11.6 10*3/uL — ABNORMAL HIGH (ref 4.0–10.5)

## 2015-11-11 LAB — URINE MICROSCOPIC-ADD ON

## 2015-11-11 LAB — LIPASE, BLOOD: LIPASE: 22 U/L (ref 11–51)

## 2015-11-11 LAB — CBG MONITORING, ED
Glucose-Capillary: 106 mg/dL — ABNORMAL HIGH (ref 65–99)
Glucose-Capillary: 68 mg/dL (ref 65–99)

## 2015-11-11 LAB — I-STAT TROPONIN, ED: Troponin i, poc: 0.01 ng/mL (ref 0.00–0.08)

## 2015-11-11 LAB — D-DIMER, QUANTITATIVE (NOT AT ARMC)

## 2015-11-11 MED ORDER — DEXTROSE 50 % IV SOLN: 50.0000 mL | Freq: Once | INTRAVENOUS | Status: DC

## 2015-11-11 MED ORDER — SODIUM CHLORIDE 0.9 % IV BOLUS (SEPSIS): 2000.0000 mL | Freq: Once | INTRAVENOUS | Status: DC

## 2015-11-11 MED ORDER — IOHEXOL 300 MG/ML  SOLN: 25.0000 mL | Freq: Once | INTRAMUSCULAR | Status: DC | PRN

## 2015-11-11 MED ORDER — ASPIRIN 81 MG PO CHEW
324.0000 mg | CHEWABLE_TABLET | Freq: Once | ORAL | Status: AC
  Administered 2015-11-11: 324 mg via ORAL
  Filled 2015-11-11: qty 4

## 2015-11-11 MED ORDER — DICYCLOMINE HCL 20 MG PO TABS: 20.0000 mg | ORAL_TABLET | Freq: Three times a day (TID) | ORAL | Status: DC | PRN

## 2015-11-11 MED ORDER — DOCUSATE SODIUM 100 MG PO CAPS: 100.0000 mg | ORAL_CAPSULE | Freq: Two times a day (BID) | ORAL | Status: DC | PRN

## 2015-11-11 MED ORDER — POLYETHYLENE GLYCOL 3350 17 G PO PACK: 17.0000 g | PACK | Freq: Every day | ORAL | Status: DC

## 2015-11-11 NOTE — ED Notes (Signed)
Per md pt does not need dextrose, given 2 orange juice containers and sandwhich

## 2015-11-11 NOTE — Progress Notes (Signed)
CM spoke with pt who confirms uninsured [place] resident with no pcp.  CM discussed and provided written information for uninsured accepting pcps, discussed the importance of pcp vs EDP services for f/u care, www.needymeds.org, www.goodrx.com, discounted pharmacies and other [place] resources such as [facility] , [facility], affordable care act, financial assistance, uninsured dental services, [place] med assist, DSS and  health department  Reviewed resources for [place] uninsured accepting pcps like [facility], [facility] [address], [facility], [facility], local urgent care centers, [facility], [facility], [facility], [facility], [facility] plus others, medication resources, [facility] out patient pharmacies and housing Pt voiced understanding and appreciation of resources provided   Provided [facility] contact information Pt agreed to a referral Cm completed referral Pt to be contact by [facility] clinical liason  

## 2015-11-11 NOTE — Discharge Instructions (Signed)
Constipation, Adult Constipation is when a person has fewer than three bowel movements a week, has difficulty having a bowel movement, or has stools that are dry, hard, or larger than normal. As people grow older, constipation is more common. A low-fiber diet, not taking in enough fluids, and taking certain medicines may make constipation worse.  CAUSES   Certain medicines, such as antidepressants, pain medicine, iron supplements, antacids, and water pills.   Certain diseases, such as diabetes, irritable bowel syndrome (IBS), thyroid disease, or depression.   Not drinking enough water.   Not eating enough fiber-rich foods.   Stress or travel.   Lack of physical activity or exercise.   Ignoring the urge to have a bowel movement.   Using laxatives too much.  SIGNS AND SYMPTOMS   Having fewer than three bowel movements a week.   Straining to have a bowel movement.   Having stools that are hard, dry, or larger than normal.   Feeling full or bloated.   Pain in the lower abdomen.   Not feeling relief after having a bowel movement.  DIAGNOSIS  Your health care provider will take a medical history and perform a physical exam. Further testing may be done for severe constipation. Some tests may include:  A barium enema X-ray to examine your rectum, colon, and, sometimes, your small intestine.   A sigmoidoscopy to examine your lower colon.   A colonoscopy to examine your entire colon. TREATMENT  Treatment will depend on the severity of your constipation and what is causing it. Some dietary treatments include drinking more fluids and eating more fiber-rich foods. Lifestyle treatments may include regular exercise. If these diet and lifestyle recommendations do not help, your health care provider may recommend taking over-the-counter laxative medicines to help you have bowel movements. Prescription medicines may be prescribed if over-the-counter medicines do not work.    HOME CARE INSTRUCTIONS   Eat foods that have a lot of fiber, such as fruits, vegetables, whole grains, and beans.  Limit foods high in fat and processed sugars, such as french fries, hamburgers, cookies, candies, and soda.   A fiber supplement may be added to your diet if you cannot get enough fiber from foods.   Drink enough fluids to keep your urine clear or pale yellow.   Exercise regularly or as directed by your health care provider.   Go to the restroom when you have the urge to go. Do not hold it.   Only take over-the-counter or prescription medicines as directed by your health care provider. Do not take other medicines for constipation without talking to your health care provider first.  SEEK IMMEDIATE MEDICAL CARE IF:   You have bright red blood in your stool.   Your constipation lasts for more than 4 days or gets worse.   You have abdominal or rectal pain.   You have thin, pencil-like stools.   You have unexplained weight loss. MAKE SURE YOU:   Understand these instructions.  Will watch your condition.  Will get help right away if you are not doing well or get worse.   This information is not intended to replace advice given to you by your health care provider. Make sure you discuss any questions you have with your health care provider.   Document Released: 08/11/2004 Document Revised: 12/04/2014 Document Reviewed: 08/25/2013 Elsevier Interactive Patient Education 2016 Elsevier Inc.  Nonspecific Chest Pain  Chest pain can be caused by many different conditions. There is always a chance that  your pain could be related to something serious, such as a heart attack or a blood clot in your lungs. Chest pain can also be caused by conditions that are not life-threatening. If you have chest pain, it is very important to follow up with your health care provider. CAUSES  Chest pain can be caused by:  Heartburn.  Pneumonia or bronchitis.  Anxiety or  stress.  Inflammation around your heart (pericarditis) or lung (pleuritis or pleurisy).  A blood clot in your lung.  A collapsed lung (pneumothorax). It can develop suddenly on its own (spontaneous pneumothorax) or from trauma to the chest.  Shingles infection (varicella-zoster virus).  Heart attack.  Damage to the bones, muscles, and cartilage that make up your chest wall. This can include:  Bruised bones due to injury.  Strained muscles or cartilage due to frequent or repeated coughing or overwork.  Fracture to one or more ribs.  Sore cartilage due to inflammation (costochondritis). RISK FACTORS  Risk factors for chest pain may include:  Activities that increase your risk for trauma or injury to your chest.  Respiratory infections or conditions that cause frequent coughing.  Medical conditions or overeating that can cause heartburn.  Heart disease or family history of heart disease.  Conditions or health behaviors that increase your risk of developing a blood clot.  Having had chicken pox (varicella zoster). SIGNS AND SYMPTOMS Chest pain can feel like:  Burning or tingling on the surface of your chest or deep in your chest.  Crushing, pressure, aching, or squeezing pain.  Dull or sharp pain that is worse when you move, cough, or take a deep breath.  Pain that is also felt in your back, neck, shoulder, or arm, or pain that spreads to any of these areas. Your chest pain may come and go, or it may stay constant. DIAGNOSIS Lab tests or other studies may be needed to find the cause of your pain. Your health care provider may have you take a test called an ambulatory ECG (electrocardiogram). An ECG records your heartbeat patterns at the time the test is performed. You may also have other tests, such as:  Transthoracic echocardiogram (TTE). During echocardiography, sound waves are used to create a picture of all of the heart structures and to look at how blood flows  through your heart.  Transesophageal echocardiogram (TEE).This is a more advanced imaging test that obtains images from inside your body. It allows your health care provider to see your heart in finer detail.  Cardiac monitoring. This allows your health care provider to monitor your heart rate and rhythm in real time.  Holter monitor. This is a portable device that records your heartbeat and can help to diagnose abnormal heartbeats. It allows your health care provider to track your heart activity for several days, if needed.  Stress tests. These can be done through exercise or by taking medicine that makes your heart beat more quickly.  Blood tests.  Imaging tests. TREATMENT  Your treatment depends on what is causing your chest pain. Treatment may include:  Medicines. These may include:  Acid blockers for heartburn.  Anti-inflammatory medicine.  Pain medicine for inflammatory conditions.  Antibiotic medicine, if an infection is present.  Medicines to dissolve blood clots.  Medicines to treat coronary artery disease.  Supportive care for conditions that do not require medicines. This may include:  Resting.  Applying heat or cold packs to injured areas.  Limiting activities until pain decreases. HOME CARE INSTRUCTIONS  If you  were prescribed an antibiotic medicine, finish it all even if you start to feel better.  Avoid any activities that bring on chest pain.  Do not use any tobacco products, including cigarettes, chewing tobacco, or electronic cigarettes. If you need help quitting, ask your health care provider.  Do not drink alcohol.  Take medicines only as directed by your health care provider.  Keep all follow-up visits as directed by your health care provider. This is important. This includes any further testing if your chest pain does not go away.  If heartburn is the cause for your chest pain, you may be told to keep your head raised (elevated) while sleeping.  This reduces the chance that acid will go from your stomach into your esophagus.  Make lifestyle changes as directed by your health care provider. These may include:  Getting regular exercise. Ask your health care provider to suggest some activities that are safe for you.  Eating a heart-healthy diet. A registered dietitian can help you to learn healthy eating options.  Maintaining a healthy weight.  Managing diabetes, if necessary.  Reducing stress. SEEK MEDICAL CARE IF:  Your chest pain does not go away after treatment.  You have a rash with blisters on your chest.  You have a fever. SEEK IMMEDIATE MEDICAL CARE IF:   Your chest pain is worse.  You have an increasing cough, or you cough up blood.  You have severe abdominal pain.  You have severe weakness.  You faint.  You have chills.  You have sudden, unexplained chest discomfort.  You have sudden, unexplained discomfort in your arms, back, neck, or jaw.  You have shortness of breath at any time.  You suddenly start to sweat, or your skin gets clammy.  You feel nauseous or you vomit.  You suddenly feel light-headed or dizzy.  Your heart begins to beat quickly, or it feels like it is skipping beats. These symptoms may represent a serious problem that is an emergency. Do not wait to see if the symptoms will go away. Get medical help right away. Call your local emergency services (911 in the [place] Do not drive yourself to the hospital.   This information is not intended to replace advice given to you by your health care provider. Make sure you discuss any questions you have with your health care provider.   Document Released: 08/23/2005 Document Revised: 12/04/2014 Document Reviewed: 06/19/2014 Elsevier Interactive Patient Education [employer].

## 2015-11-11 NOTE — ED Provider Notes (Signed)
CSN: [id number]     Arrival date & time 11/11/15  1316 History   First MD Initiated Contact with Patient 11/11/15 1327     Chief Complaint  Patient presents with  . Chest Pain  . Abdominal Pain     (Consider location/radiation/quality/duration/timing/severity/associated sxs/prior Treatment) HPI Patient presents with 2 days of episodic left lower chest and right-sided abdominal pain. States the pain is sharp in nature. Denies any shortness of breath, cough, fever or chills. No nausea or vomiting. No sick contacts. Denies any lower extremity swelling or pain. No recent extended travel. Patient states he is unsure when he last had a bowel movement. Past Medical History  Diagnosis Date  . Asthma    History reviewed. No pertinent past surgical history. History reviewed. No pertinent family history. Social History  Substance Use Topics  . Smoking status: Never Smoker   . Smokeless tobacco: None  . Alcohol Use: No    Review of Systems  Constitutional: Negative for fever and chills.  Respiratory: Negative for cough and shortness of breath.   Cardiovascular: Positive for chest pain. Negative for palpitations and leg swelling.  Gastrointestinal: Positive for abdominal pain, constipation and abdominal distention. Negative for nausea, vomiting and diarrhea.  Genitourinary: Negative for dysuria, frequency and flank pain.  Musculoskeletal: Negative for myalgias, back pain, neck pain and neck stiffness.  Skin: Negative for rash and wound.  Neurological: Negative for dizziness, weakness, light-headedness, numbness and headaches.  All other systems reviewed and are negative.     Allergies  Review of patient's allergies indicates no known allergies.  Home Medications   Prior to Admission medications   Medication Sig Start Date End Date Taking? Authorizing Provider  albuterol (PROVENTIL HFA;VENTOLIN HFA) 108 (90 BASE) MCG/ACT inhaler Inhale 1-2 puffs into the lungs every 4 (four) hours  as needed for wheezing or shortness of breath. 10/08/15  Yes Ardith Dark, MD  butalbital-acetaminophen-caffeine (FIORICET) [phone] MG per tablet Take 1 tablet by mouth every 6 (six) hours as needed for headache or migraine. Patient not taking: Reported on 06/15/2015 03/04/15   [name], PA-C  dicyclomine (BENTYL) 20 MG tablet Take 1 tablet (20 mg total) by mouth 3 (three) times daily as needed for spasms. 11/11/15   [name], MD  docusate sodium (COLACE) 100 MG capsule Take 1 capsule (100 mg total) by mouth 2 (two) times daily as needed for mild constipation or moderate constipation. 11/11/15   [name], MD  loratadine (CLARITIN) 10 MG tablet Take 1 tablet (10 mg total) by mouth daily. Patient not taking: Reported on 08/08/2015 06/15/15   [name], PA-C  polyethylene glycol [facility] / GLYCOLAX) packet Take 17 g by mouth daily. 11/11/15   [name], MD  predniSONE (DELTASONE) 50 MG tablet Take 1 tablet daily with breakfast Patient not taking: Reported on 08/08/2015 06/15/15   [name], PA-C   BP 100/59 mmHg  Pulse 62  Temp(Src) 98.7 F (37.1 C) (Oral)  Resp 20  SpO2 98% Physical Exam  Constitutional: He is oriented to person, place, and time. He appears well-developed and well-nourished. No distress.  HENT:  Head: Normocephalic and atraumatic.  Mouth/Throat: Oropharynx is clear and moist.  Eyes: EOM are normal. Pupils are equal, round, and reactive to light.  Neck: Normal range of motion. Neck supple.  Cardiovascular: Normal rate and regular rhythm.  Exam reveals no gallop and no friction rub.   No murmur heard. Pulmonary/Chest: Effort normal and breath sounds normal. No respiratory distress. He has no  wheezes. He has no rales. He exhibits tenderness (mild tenderness to palpation over the left inferior rib cage. No crepitance or deformity.).  Abdominal: Soft. Bowel sounds are normal. He exhibits distension. He exhibits no mass. There is tenderness. There is  no rebound and no guarding.  Musculoskeletal: Normal range of motion. He exhibits no edema or tenderness.  No CVA tenderness bilaterally.  Neurological: He is alert and oriented to person, place, and time.  Skin: Skin is warm and dry. No rash noted. No erythema.  Psychiatric: He has a normal mood and affect. His behavior is normal.  Nursing note and vitals reviewed.   ED Course  Procedures (including critical care time) Labs Review Labs Reviewed  COMPREHENSIVE METABOLIC PANEL - Abnormal; Notable for the following:    Glucose, Bld 56 (*)    Creatinine, Ser 1.53 (*)    ALT 11 (*)    Total Bilirubin 1.7 (*)    GFR calc non Af Amer 60 (*)    All other components within normal limits  CBC - Abnormal; Notable for the following:    WBC 11.6 (*)    All other components within normal limits  URINALYSIS, ROUTINE W REFLEX MICROSCOPIC (NOT AT [facility]) - Abnormal; Notable for the following:    Protein, ur 30 (*)    All other components within normal limits  URINE MICROSCOPIC-ADD ON - Abnormal; Notable for the following:    Squamous Epithelial / LPF 0-5 (*)    Bacteria, UA RARE (*)    Casts HYALINE CASTS (*)    All other components within normal limits  LIPASE, BLOOD  D-DIMER, QUANTITATIVE (NOT AT [facility])  I-STAT TROPOININ, ED  CBG MONITORING, ED    Imaging Review Dg Abd Acute W/chest  11/11/2015  CLINICAL DATA:  Mid/lower chest pain for the past 2 days. History of asthma. Nonsmoker. EXAM: DG ABDOMEN ACUTE W/ 1V CHEST COMPARISON:  08/08/2015; 09/22/2013; 12/26/2012 FINDINGS: Grossly unchanged cardiac silhouette and mediastinal contours. No focal parenchymal opacities. No pleural effusion or pneumothorax. No evidence of edema. Moderate colonic stool burden without evidence of enteric obstruction. No pneumoperitoneum, pneumatosis or portal venous gas. No definite abnormal intra-abdominal calcifications. No acute osseous abnormalities. IMPRESSION: 1.  No acute cardiopulmonary disease. 2. Moderate  colonic stool burden without evidence of enteric obstruction. Electronically Signed   By: Simonne ComeJohn  Watts M.D.   On: 11/11/2015 14:16   I have personally reviewed and evaluated these images and lab results as part of my medical decision-making.   EKG Interpretation   Date/Time:  Thursday November 11 2015 13:22:38 EST Ventricular Rate:  110 PR Interval:  187 QRS Duration: 87 QT Interval:  318 QTC Calculation: 430 R Axis:   -10 Text Interpretation:  Sinus tachycardia Right atrial enlargement Probable  right ventricular hypertrophy Inferior infarct, acute (LCx) ST elevation,  consider anterior injury Lateral leads are also involved Confirmed by  Spiros Greenfeld  MD, Raquel Sayres ([id number]) on 11/11/2015 3:11:09 PM      MDM   Final diagnoses:  Constipation, unspecified constipation type  Atypical chest pain  Abdominal spasms  Hypoglycemia    Patient has diffuse ST segment elevation without reciprocal changes. Questionable pericarditis.  Patient states his symptoms have completely resolved. No chest pain or abdominal pain. Abdominal exam without any tenderness. Patient's tachycardia has also resolved. He is in no distress. EKG with diffuse ST segment elevation similar to previous EKG. No suspicion for ischemia. Troponin is normal. D-dimer is normal. Noted to be hypoglycemic We'll give juice and crackers and  retest. Patient states he does not drink very much fluids. He is encouraged to increase his water intake. Also start on bowel regimen for constipation. He's been instructed to return immediately for any worsening pain, fever or concerns.  Improve CBG after juice. Return precautions given.  [name], MD 11/11/15 1540

## 2015-11-11 NOTE — ED Notes (Signed)
Pt told he must eat sandwich and drink another orange juice before he can be discharged.

## 2015-11-11 NOTE — ED Notes (Signed)
md at bedside  Pt alert and oriented x4. Respirations even and unlabored, bilateral symmetrical rise and fall of chest. Skin warm and dry. In no acute distress. Denies needs.   

## 2015-11-11 NOTE — ED Notes (Signed)
Pt here with chest and abdominal pain. Has occurred for 3 days.  No n/v/d.  No fever. Slight congestion.  No shortness of breath.  No pain radiation.

## 2015-11-11 NOTE — ED Notes (Signed)
Pt back from x-ray.

## 2016-01-24 ENCOUNTER — Emergency Department (HOSPITAL_COMMUNITY)
Admission: EM | Admit: 2016-01-24 | Discharge: 2016-01-24 | Disposition: A | Discharge status: Home / Self Care | Payer: Self-pay | Attending: Emergency Medicine | Admitting: Emergency Medicine

## 2016-01-24 ENCOUNTER — Encounter (HOSPITAL_COMMUNITY): Payer: Self-pay | Admitting: Emergency Medicine

## 2016-01-24 DIAGNOSIS — Z79899 Other long term (current) drug therapy: Secondary | ICD-10-CM | POA: Insufficient documentation

## 2016-01-24 DIAGNOSIS — J9801 Acute bronchospasm: Secondary | ICD-10-CM

## 2016-01-24 DIAGNOSIS — J45901 Unspecified asthma with (acute) exacerbation: Secondary | ICD-10-CM | POA: Insufficient documentation

## 2016-01-24 MED ORDER — ALBUTEROL SULFATE HFA 108 (90 BASE) MCG/ACT IN AERS
1.0000 | INHALATION_SPRAY | RESPIRATORY_TRACT | Status: DC | PRN
Start: 2016-01-24 — End: 2017-03-19

## 2016-01-24 MED ORDER — ALBUTEROL SULFATE (2.5 MG/3ML) 0.083% IN NEBU
5.0000 mg | INHALATION_SOLUTION | Freq: Once | RESPIRATORY_TRACT | Status: AC
  Administered 2016-01-24: 5 mg via RESPIRATORY_TRACT
  Filled 2016-01-24: qty 6

## 2016-01-24 MED ORDER — PREDNISONE 50 MG PO TABS: ORAL_TABLET | ORAL | Status: DC

## 2016-01-24 MED ORDER — IPRATROPIUM BROMIDE 0.02 % IN SOLN
0.5000 mg | Freq: Once | RESPIRATORY_TRACT | Status: AC
  Administered 2016-01-24: 0.5 mg via RESPIRATORY_TRACT
  Filled 2016-01-24: qty 2.5

## 2016-01-24 MED ORDER — PREDNISONE 20 MG PO TABS
60.0000 mg | ORAL_TABLET | Freq: Once | ORAL | Status: AC
  Administered 2016-01-24: 60 mg via ORAL
  Filled 2016-01-24: qty 3

## 2016-01-24 MED ORDER — ALBUTEROL SULFATE HFA 108 (90 BASE) MCG/ACT IN AERS
2.0000 | INHALATION_SPRAY | Freq: Once | RESPIRATORY_TRACT | Status: AC
  Administered 2016-01-24: 2 via RESPIRATORY_TRACT
  Filled 2016-01-24: qty 6.7

## 2016-01-24 NOTE — ED Notes (Signed)
Pt states that he feels his asthma has gotten worse this week as he is out of his inhaler. Pt is speaking in complete sentences and is not visibly SOB. Alert and oreinted.

## 2016-01-24 NOTE — Discharge Instructions (Signed)
Asthma, Adult Asthma is a recurring condition in which the airways tighten and narrow. Asthma can make it difficult to breathe. It can cause coughing, wheezing, and shortness of breath. Asthma episodes, also called asthma attacks, range from minor to life-threatening. Asthma cannot be cured, but medicines and lifestyle changes can help control it. CAUSES Asthma is believed to be caused by inherited (genetic) and environmental factors, but its exact cause is unknown. Asthma may be triggered by allergens, lung infections, or irritants in the air. Asthma triggers are different for each person. Common triggers include:   Animal dander.  Dust mites.  Cockroaches.  Pollen from trees or grass.  Mold.  Smoke.  Air pollutants such as dust, household cleaners, hair sprays, aerosol sprays, paint fumes, strong chemicals, or strong odors.  Cold air, weather changes, and winds (which increase molds and pollens in the air).  Strong emotional expressions such as crying or laughing hard.  Stress.  Certain medicines (such as aspirin) or types of drugs (such as beta-blockers).  Sulfites in foods and drinks. Foods and drinks that may contain sulfites include dried fruit, potato chips, and sparkling grape juice.  Infections or inflammatory conditions such as the flu, a cold, or an inflammation of the nasal membranes (rhinitis).  Gastroesophageal reflux disease (GERD).  Exercise or strenuous activity. SYMPTOMS Symptoms may occur immediately after asthma is triggered or many hours later. Symptoms include:  Wheezing.  Excessive nighttime or early morning coughing.  Frequent or severe coughing with a common cold.  Chest tightness.  Shortness of breath. DIAGNOSIS  The diagnosis of asthma is made by a review of your medical history and a physical exam. Tests may also be performed. These may include:  Lung function studies. These tests show how much air you breathe in and out.  Allergy  tests.  Imaging tests such as X-rays. TREATMENT  Asthma cannot be cured, but it can usually be controlled. Treatment involves identifying and avoiding your asthma triggers. It also involves medicines. There are 2 classes of medicine used for asthma treatment:   Controller medicines. These prevent asthma symptoms from occurring. They are usually taken every day.  Reliever or rescue medicines. These quickly relieve asthma symptoms. They are used as needed and provide short-term relief. Your health care provider will help you create an asthma action plan. An asthma action plan is a written plan for managing and treating your asthma attacks. It includes a list of your asthma triggers and how they may be avoided. It also includes information on when medicines should be taken and when their dosage should be changed. An action plan may also involve the use of a device called a peak flow meter. A peak flow meter measures how well the lungs are working. It helps you monitor your condition. HOME CARE INSTRUCTIONS   Take medicines only as directed by your health care provider. Speak with your health care provider if you have questions about how or when to take the medicines.  Use a peak flow meter as directed by your health care provider. Record and keep track of readings.  Understand and use the action plan to help minimize or stop an asthma attack without needing to seek medical care.  Control your home environment in the following ways to help prevent asthma attacks:  Do not smoke. Avoid being exposed to secondhand smoke.  Change your heating and air conditioning filter regularly.  Limit your use of fireplaces and wood stoves.  Get rid of pests (such as roaches   and mice) and their droppings.  Throw away plants if you see mold on them.  Clean your floors and dust regularly. Use unscented cleaning products.  Try to have someone else vacuum for you regularly. Stay out of rooms while they are  being vacuumed and for a short while afterward. If you vacuum, use a dust mask from a hardware store, a double-layered or microfilter vacuum cleaner bag, or a vacuum cleaner with a HEPA filter.  Replace carpet with wood, tile, or vinyl flooring. Carpet can trap dander and dust.  Use allergy-proof pillows, mattress covers, and box spring covers.  Wash bed sheets and blankets every week in hot water and dry them in a dryer.  Use blankets that are made of polyester or cotton.  Clean bathrooms and kitchens with bleach. If possible, have someone repaint the walls in these rooms with mold-resistant paint. Keep out of the rooms that are being cleaned and painted.  Wash hands frequently. SEEK MEDICAL CARE IF:   You have wheezing, shortness of breath, or a cough even if taking medicine to prevent attacks.  The colored mucus you cough up (sputum) is thicker than usual.  Your sputum changes from clear or white to yellow, green, gray, or bloody.  You have any problems that may be related to the medicines you are taking (such as a rash, itching, swelling, or trouble breathing).  You are using a reliever medicine more than 2-3 times per week.  Your peak flow is still at 50-79% of your personal best after following your action plan for 1 hour.  You have a fever. SEEK IMMEDIATE MEDICAL CARE IF:   You seem to be getting worse and are unresponsive to treatment during an asthma attack.  You are short of breath even at rest.  You get short of breath when doing very little physical activity.  You have difficulty eating, drinking, or talking due to asthma symptoms.  You develop chest pain.  You develop a fast heartbeat.  You have a bluish color to your lips or fingernails.  You are light-headed, dizzy, or faint.  Your peak flow is less than 50% of your personal best.   This information is not intended to replace advice given to you by your health care provider. Make sure you discuss any  questions you have with your health care provider.   Document Released: 11/13/2005 Document Revised: 08/04/2015 Document Reviewed: 06/12/2013 Elsevier Interactive Patient Education 2016 [employer]  

## 2016-01-24 NOTE — ED Provider Notes (Signed)
CSN: [id number]     Arrival date & time 01/24/16  1859 History  By signing my name below, I, Nicholas Braun, attest that this documentation has been prepared under the direction and in the presence of [employer], PA-C. Electronically Signed: Doreatha Braun, ED Scribe. 01/24/2016. 9:57 PM.     Chief Complaint  Patient presents with  . Asthma    The history is provided by the patient. No language interpreter was used.   HPI Comments: Nicholas Braun is a 30 y.o. male with h/o asthma who presents to the Emergency Department complaining of moderate, gradually worsening chest tightness onset 2 days ago with associated wheezing, intermittently productive cough. Pt states that his chest tightness is exacerbated at night and with coughing. Pt notes that he has recently run out of his inhaler. He reports that his current symptoms are similar to prior asthma exacerbations. He states he takes qd claritin. Pt is a non-smoker. Pt denies fever.     Past Medical History  Diagnosis Date  . Asthma    History reviewed. No pertinent past surgical history. History reviewed. No pertinent family history. Social History  Substance Use Topics  . Smoking status: Never Smoker   . Smokeless tobacco: None  . Alcohol Use: No    Review of Systems  Constitutional: Negative for fever.  Respiratory: Positive for cough, chest tightness and wheezing.   All other systems reviewed and are negative.   Allergies  Review of patient's allergies indicates no known allergies.  Home Medications   Prior to Admission medications   Medication Sig Start Date End Date Taking? Authorizing Provider  albuterol (PROVENTIL HFA;VENTOLIN HFA) 108 (90 Base) MCG/ACT inhaler Inhale 1-2 puffs into the lungs every 4 (four) hours as needed for wheezing or shortness of breath. 01/24/16   [name], PA-C  butalbital-acetaminophen-caffeine (FIORICET) [phone] MG per tablet Take 1 tablet by mouth every 6 (six) hours as needed for  headache or migraine. Patient not taking: Reported on 06/15/2015 03/04/15   [name], PA-C  dicyclomine (BENTYL) 20 MG tablet Take 1 tablet (20 mg total) by mouth 3 (three) times daily as needed for spasms. 11/11/15   [name], MD  docusate sodium (COLACE) 100 MG capsule Take 1 capsule (100 mg total) by mouth 2 (two) times daily as needed for mild constipation or moderate constipation. 11/11/15   [name], MD  loratadine (CLARITIN) 10 MG tablet Take 1 tablet (10 mg total) by mouth daily. Patient not taking: Reported on 08/08/2015 06/15/15   [name], PA-C  polyethylene glycol [facility] / GLYCOLAX) packet Take 17 g by mouth daily. 11/11/15   [name], MD  predniSONE (DELTASONE) 50 MG tablet Take 1 tablet daily with breakfast 01/24/16   [name], PA-C   BP 143/70 mmHg  Pulse 87  Temp(Src) 97.8 F (36.6 C) (Oral)  Resp 16  SpO2 100%   Physical Exam  Constitutional: He is oriented to person, place, and time. He appears well-developed and well-nourished. No distress.  Nontoxic/nonseptic appearing  HENT:  Head: Normocephalic and atraumatic.  Eyes: Conjunctivae and EOM are normal. No scleral icterus.  Neck: Normal range of motion.  Cardiovascular: Normal rate, regular rhythm and intact distal pulses.   Pulmonary/Chest: Effort normal. No respiratory distress. He has wheezes. He has no rales.  Mild inspiratory and expiratory wheezes. Decreased breath sounds diffusely. No tachypnea or dyspnea. No hypoxia.  Musculoskeletal: Normal range of motion.  Neurological: He is alert and oriented to person, place, and time. He  exhibits normal muscle tone. Coordination normal.  Ambulatory with steady gait  Skin: Skin is warm and dry. No rash noted. He is not diaphoretic. No erythema. No pallor.  Psychiatric: He has a normal mood and affect. His behavior is normal.  Nursing note and vitals reviewed.   ED Course  Procedures (including critical care time)  DIAGNOSTIC  STUDIES: Oxygen Saturation is 98% on RA, normal by my interpretation.    COORDINATION OF CARE: 9:56 PM Discussed treatment plan with pt at bedside which includes breathing tx, steroids and pt agreed to plan.    MDM   Final diagnoses:  Acute bronchospasm    Patient presenting for c/o SOB, wheezing, and chest tightness. He is afebrile. Mild symptoms on initial exam. Lung exam improved after nebulizer treatment. Prednisone given in the ED and patien will be discharged with 5 day burst. Patient states they are breathing at baseline. Patient has been instructed to continue using prescribed medications and to speak with a PCP about today's exacerbation. Return precautions given at discharge. Patient agreeable to plan with no unaddressed concerns; discharged in good condition.   I personally performed the services described in this documentation, which was scribed in my presence. The recorded information has been reviewed and is accurate.    Filed Vitals:   01/24/16 1959 01/24/16 2307  BP: 101/61 143/70  Pulse: 77 87  Temp: 97.8 F (36.6 C)   TempSrc: Oral   Resp: 20 16  SpO2: 98% 100%     [name], PA-C 01/24/16 2315  [name], MD 01/25/16 1212

## 2016-07-02 ENCOUNTER — Emergency Department (HOSPITAL_COMMUNITY)
Admission: EM | Admit: 2016-07-02 | Discharge: 2016-07-02 | Disposition: A | Discharge status: Home / Self Care | Payer: Self-pay | Attending: Emergency Medicine | Admitting: Emergency Medicine

## 2016-07-02 ENCOUNTER — Encounter (HOSPITAL_COMMUNITY): Payer: Self-pay | Admitting: Oncology

## 2016-07-02 DIAGNOSIS — M542 Cervicalgia: Secondary | ICD-10-CM | POA: Insufficient documentation

## 2016-07-02 DIAGNOSIS — Z7951 Long term (current) use of inhaled steroids: Secondary | ICD-10-CM | POA: Insufficient documentation

## 2016-07-02 DIAGNOSIS — Z79899 Other long term (current) drug therapy: Secondary | ICD-10-CM | POA: Insufficient documentation

## 2016-07-02 DIAGNOSIS — J45909 Unspecified asthma, uncomplicated: Secondary | ICD-10-CM | POA: Insufficient documentation

## 2016-07-02 MED ORDER — NAPROXEN 500 MG PO TABS: 500.0000 mg | ORAL_TABLET | Freq: Two times a day (BID) | ORAL | 0 refills | Status: AC

## 2016-07-02 MED ORDER — DIAZEPAM 5 MG PO TABS: 5.0000 mg | ORAL_TABLET | Freq: Two times a day (BID) | ORAL | 0 refills | Status: AC

## 2016-07-02 MED ORDER — DIAZEPAM 5 MG PO TABS
5.0000 mg | ORAL_TABLET | Freq: Once | ORAL | Status: AC
  Administered 2016-07-02: 5 mg via ORAL
  Filled 2016-07-02: qty 1

## 2016-07-02 MED ORDER — KETOROLAC TROMETHAMINE 30 MG/ML IJ SOLN
30.0000 mg | Freq: Once | INTRAMUSCULAR | Status: AC
  Administered 2016-07-02: 30 mg via INTRAMUSCULAR
  Filled 2016-07-02: qty 1

## 2016-07-02 NOTE — Discharge Instructions (Signed)
As discussed, with your ongoing neck pain it is important to take all medication as directed, and use ice packs, 4 times daily for the next few days. Monitor your condition carefully, and return here for concerning changes.

## 2016-07-02 NOTE — ED Provider Notes (Signed)
[facility]-EMERGENCY DEPT Provider Note   CSN: [id number] Arrival date & time: 07/02/16  0350  First Provider Contact:  First MD Initiated Contact with Patient 07/02/16 0701        History   Chief Complaint No chief complaint on file.   HPI Nicholas Braun is a 30 y.o. male.  HPI Patient presents with pain and discomfort in the right lateral neck, head diffusely. Symptoms began yesterday, upon awakening, approximately 24 hours ago. He was well going to sleep the night prior. Since onset symptoms of been persistent, with exquisite pain throughout the right lateral neck, and shooting pain with the patient moves his head laterally. No associated confusion, disorientation, syncope, vision changes, nausea, vomiting, diarrhea. Patient had some relief with ibuprofen, after one dose. He denies history of substance illness, neck surgery. He denies any weakness in any extremity. Patient works as a Public affairs consultant.  Past Medical History:  Diagnosis Date  . Asthma     There are no active problems to display for this patient.   History reviewed. No pertinent surgical history.     Home Medications    Prior to Admission medications   Medication Sig Start Date End Date Taking? Authorizing Provider  albuterol (PROVENTIL HFA;VENTOLIN HFA) 108 (90 Base) MCG/ACT inhaler Inhale 1-2 puffs into the lungs every 4 (four) hours as needed for wheezing or shortness of breath. 01/24/16  Yes [name], PA-C  loratadine (CLARITIN) 10 MG tablet Take 10 mg by mouth daily as needed for allergies.   Yes Historical Provider, MD    Family History No family history on file.  Social History Social History  Substance Use Topics  . Smoking status: Never Smoker  . Smokeless tobacco: Never Used  . Alcohol use No     Allergies   Food   Review of Systems Review of Systems  Constitutional:       Per HPI, otherwise negative  HENT:       Per HPI, otherwise negative  Respiratory:       Per HPI,  otherwise negative  Cardiovascular:       Per HPI, otherwise negative  Gastrointestinal: Negative for vomiting.  Endocrine:       Negative aside from HPI  Genitourinary:       Neg aside from HPI   Musculoskeletal: Positive for neck pain and neck stiffness. Negative for back pain.  Skin: Negative for rash.  Neurological: Negative for syncope.     Physical Exam Updated Vital Signs BP 123/80   Pulse (!) 54   Temp 98.4 F (36.9 C) (Oral)   Resp 18   Ht  (1.981 m)   SpO2 98%   Physical Exam  Constitutional: He is oriented to person, place, and time. He appears well-developed. No distress.  HENT:  Head: Normocephalic and atraumatic.  Eyes: Conjunctivae and EOM are normal.  Neck: Trachea normal. Neck supple. Muscular tenderness present. No spinous process tenderness present. No neck rigidity. Decreased range of motion present. No tracheal deviation, no edema and no erythema present.    Cardiovascular: Normal rate and regular rhythm.   Pulmonary/Chest: Effort normal. No stridor. No respiratory distress.  Abdominal: He exhibits no distension.  Musculoskeletal: He exhibits no edema.  Neurological: He is alert and oriented to person, place, and time. He displays no atrophy and no tremor. No cranial nerve deficit or sensory deficit.  Skin: Skin is warm and dry.  Psychiatric: He has a normal mood and affect.  Nursing note and vitals reviewed.  ED Treatments / Results   Procedures Procedures (including critical care time)  Medications Ordered in ED Medications  ketorolac (TORADOL) 30 MG/ML injection 30 mg (not administered)  diazepam (VALIUM) tablet 5 mg (not administered)     Initial Impression / Assessment and Plan / ED Course  I have reviewed the triage vital signs and the nursing notes.  Pertinent labs & imaging results that were available during my care of the patient were reviewed by me and considered in my medical decision making (see chart for  details).  Clinical Course    8:43 AM Patient states that he is feeling better. We discussed all thoughts, need for cryotherapy, medications, monitoring.  Final Clinical Impressions(s) / ED Diagnoses   Patient presents with new right paraspinal pain, no neurologic deficits. Symptoms consistent with acute torticollis. With new neurologic red flags, and beginning of improvement symptomatically, the patient was discharged in stable condition with cryotherapy, meds.  New Prescriptions New Prescriptions   DIAZEPAM (VALIUM) 5 MG TABLET    Take 1 tablet (5 mg total) by mouth 2 (two) times daily.   NAPROXEN (NAPROSYN) 500 MG TABLET    Take 1 tablet (500 mg total) by mouth 2 (two) times daily.     Gerhard Munchobert Gavynn Duvall, MD 07/02/16 [phone]

## 2016-07-02 NOTE — ED Triage Notes (Signed)
Per pt when he woke up yesterday he was unable to move his neck.  Pt has to turn entire body in order to look in different directions.  Denies injury.  Pt also c/o HA.  Rates pain 8/10.

## 2017-03-19 ENCOUNTER — Emergency Department (HOSPITAL_COMMUNITY)
Admission: EM | Admit: 2017-03-19 | Discharge: 2017-03-19 | Disposition: A | Discharge status: Home / Self Care | Payer: Self-pay | Attending: Emergency Medicine | Admitting: Emergency Medicine

## 2017-03-19 ENCOUNTER — Emergency Department (HOSPITAL_COMMUNITY): Payer: Self-pay

## 2017-03-19 ENCOUNTER — Encounter (HOSPITAL_COMMUNITY): Payer: Self-pay | Admitting: *Deleted

## 2017-03-19 DIAGNOSIS — R0789 Other chest pain: Secondary | ICD-10-CM | POA: Insufficient documentation

## 2017-03-19 DIAGNOSIS — J45909 Unspecified asthma, uncomplicated: Secondary | ICD-10-CM | POA: Insufficient documentation

## 2017-03-19 DIAGNOSIS — Z79899 Other long term (current) drug therapy: Secondary | ICD-10-CM | POA: Insufficient documentation

## 2017-03-19 LAB — I-STAT TROPONIN, ED: TROPONIN I, POC: 0 ng/mL (ref 0.00–0.08)

## 2017-03-19 LAB — CBC
HCT: 42 % (ref 39.0–52.0)
Hemoglobin: 13.8 g/dL (ref 13.0–17.0)
MCH: 29.2 pg (ref 26.0–34.0)
MCHC: 32.9 g/dL (ref 30.0–36.0)
MCV: 89 fL (ref 78.0–100.0)
Platelets: 272 10*3/uL (ref 150–400)
RBC: 4.72 MIL/uL (ref 4.22–5.81)
RDW: 12.1 % (ref 11.5–15.5)
WBC: 4.9 10*3/uL (ref 4.0–10.5)

## 2017-03-19 LAB — BASIC METABOLIC PANEL
ANION GAP: 8 (ref 5–15)
BUN: 9 mg/dL (ref 6–20)
CALCIUM: 8.9 mg/dL (ref 8.9–10.3)
CO2: 26 mmol/L (ref 22–32)
Chloride: 106 mmol/L (ref 101–111)
Creatinine, Ser: 1.19 mg/dL (ref 0.61–1.24)
GFR calc Af Amer: >60 mL/min (ref >60)
Glucose, Bld: 101 mg/dL — ABNORMAL HIGH (ref 65–99)
Potassium: 3.8 mmol/L (ref 3.5–5.1)
SODIUM: 140 mmol/L (ref 135–145)

## 2017-03-19 MED ORDER — ALBUTEROL SULFATE HFA 108 (90 BASE) MCG/ACT IN AERS: 1.0000 | INHALATION_SPRAY | RESPIRATORY_TRACT | 0 refills | Status: DC | PRN

## 2017-03-19 NOTE — ED Triage Notes (Signed)
Pt stat that he began having rt sided chest pain starting 2 days ago. Pt states that the pain is worse with movement.

## 2017-03-19 NOTE — ED Provider Notes (Signed)
[facility]-EMERGENCY DEPT Provider Note   CSN: [id number] Arrival date & time: 03/19/17  1431   By signing my name below, I, Soijett Blue, attest that this documentation has been prepared under the direction and in the presence of Gwyneth Sprout, MD. Electronically Signed: Soijett Blue, ED Scribe. 03/19/17. 5:26 PM.  History   Chief Complaint Chief Complaint  Patient presents with  . Chest Pain    HPI Nicholas Braun is a 31 y.o. male with a PMHx of asthma, who presents to the Emergency Department complaining of gradually worsening, right sided CP onset 2 days ago. He notes that he was picking up his 70 month old son when the CP began. Pt right sided CP is worsened with movement of his right arm and taking a deep breath. He notes that he has never had similar symptoms in the past. Pt reports associated right shoulder pain, nasal congestion, cough, sneezing, and wheezing. He notes that he has a hx of seasonal allergies and his asthma is exacerbated during this time of the year. Pt has tried his albuterol inhaler, tylenol, aleve, icy-hot, benadryl, and claritin with mild relief of his symptoms. Denies numbness, tingling, and any other symptoms. Denies family hx of cardiac issues.      The history is provided by the patient. No language interpreter was used.    Past Medical History:  Diagnosis Date  . Asthma     There are no active problems to display for this patient.   History reviewed. No pertinent surgical history.     Home Medications    Prior to Admission medications   Medication Sig Start Date End Date Taking? Authorizing Provider  albuterol (PROVENTIL HFA;VENTOLIN HFA) 108 (90 Base) MCG/ACT inhaler Inhale 1-2 puffs into the lungs every 4 (four) hours as needed for wheezing or shortness of breath. 01/24/16   [name], PA-C  loratadine (CLARITIN) 10 MG tablet Take 10 mg by mouth daily as needed for allergies.    Historical Provider, MD    Family History No family  history on file.  Social History Social History  Substance Use Topics  . Smoking status: Never Smoker  . Smokeless tobacco: Never Used  . Alcohol use No     Allergies   Food   Review of Systems Review of Systems  All other systems reviewed and are negative.    Physical Exam Updated Vital Signs BP 125/88   Pulse 74   Temp 98.2 F (36.8 C) (Oral)   Resp 18   Ht  (1.981 m)   SpO2 100%   Physical Exam  Constitutional: He is oriented to person, place, and time. He appears well-developed and well-nourished. No distress.  HENT:  Head: Normocephalic and atraumatic.  Eyes: EOM are normal.  Neck: Neck supple.  Cardiovascular: Normal rate, regular rhythm and normal heart sounds.  Exam reveals no gallop and no friction rub.   No murmur heard. Pulses:      Radial pulses are 2+ on the right side.  2+ radial pulse to right wrist.  Pulmonary/Chest: Effort normal. No respiratory distress. He has decreased breath sounds. He has no wheezes. He has no rales. He exhibits tenderness.  Mild decreased breath sounds bilaterally. Significant chest wall tenderness with palpation of right chest and right shoulder.   Abdominal: He exhibits no distension.  Musculoskeletal: Normal range of motion.  Neurological: He is alert and oriented to person, place, and time. He has normal strength.  Nl grip strength.   Skin: Skin  is warm and dry.  Psychiatric: He has a normal mood and affect. His behavior is normal.  Nursing note and vitals reviewed.    ED Treatments / Results  DIAGNOSTIC STUDIES: Oxygen Saturation is 100% on RA, nl by my interpretation.    COORDINATION OF CARE: 5:23 PM Discussed treatment plan with pt at bedside which includes labs, EKG, CXR, and pt agreed to plan.   Labs (all labs ordered are listed, but only abnormal results are displayed) Labs Reviewed  BASIC METABOLIC PANEL - Abnormal; Notable for the following:       Result Value   Glucose, Bld 101 (*)    All  other components within normal limits  CBC  I-STAT TROPOININ, ED    EKG  EKG Interpretation  Date/Time:  Monday March 19 2017 14:36:16 EDT Ventricular Rate:  86 PR Interval:  172 QRS Duration: 78 QT Interval:  344 QTC Calculation: 411 R Axis:   65 Text Interpretation:  Normal sinus rhythm Early repolarization Normal ECG No significant change since last tracing Confirmed by Anitra Lauth  MD, Alphonzo Lemmings ([id number]) on 03/19/2017 5:12:07 PM       Radiology Dg Chest 2 View  Result Date: 03/19/2017 CLINICAL DATA:  Nonradiating RIGHT chest pain for 3 days, worse with movement. History of asthma. EXAM: CHEST  2 VIEW COMPARISON:  Chest radiograph November 11, 2015 FINDINGS: Cardiomediastinal silhouette is normal. No pleural effusions or focal consolidations. Apical bullous suspected. Trachea projects midline and there is no pneumothorax. Soft tissue planes and included osseous structures are non-suspicious. IMPRESSION: Suspected apical bulla without acute cardiopulmonary process. Electronically Signed   By: Awilda Metro M.D.   On: 03/19/2017 16:03    Procedures Procedures (including critical care time)  Medications Ordered in ED Medications - No data to display   Initial Impression / Assessment and Plan / ED Course  I have reviewed the triage vital signs and the nursing notes.  Pertinent labs & imaging results that were available during my care of the patient were reviewed by me and considered in my medical decision making (see chart for details).     Patient is a 31 year old male presenting with chest wall pain today. Patient is worse with moving his right arm and with taking deep breaths. He has had some allergy type symptoms with mild exacerbation of his asthma but last had use his inhaler 3 days ago. He cannot recall any injury. He denies any infectious symptoms. No family history of heart disease and he does not have any heart disease. He does not use tobacco.  On exam pain is  reproducible with palpation to the right side of the chest and shoulder. Moving his arm also reproduces the pain. EKG without acute findings, troponin, BMP and CBC done in the waiting room were within normal limits. Chest x-ray showsapical without acute findings. Patient denies any shortness of breath and oxygen saturation are 100%.  Low suspicion for pneumothorax, pericarditis or ACS. Most likely musculoskeletal. Recommended that patient continue ibuprofen, Tylenol and muscle rubs.  Final Clinical Impressions(s) / ED Diagnoses   Final diagnoses:  Chest wall pain    New Prescriptions Current Discharge Medication List     I personally performed the services described in this documentation, which was scribed in my presence.  The recorded information has been reviewed and considered.     Gwyneth Sprout, MD 03/19/17 1734

## 2017-07-09 ENCOUNTER — Encounter (HOSPITAL_COMMUNITY): Payer: Self-pay

## 2017-07-09 ENCOUNTER — Emergency Department (HOSPITAL_COMMUNITY)
Admission: EM | Admit: 2017-07-09 | Discharge: 2017-07-10 | Disposition: A | Discharge status: Home / Self Care | Payer: BLUE CROSS/BLUE SHIELD | Attending: Emergency Medicine | Admitting: Emergency Medicine

## 2017-07-09 DIAGNOSIS — J45909 Unspecified asthma, uncomplicated: Secondary | ICD-10-CM | POA: Diagnosis not present

## 2017-07-09 DIAGNOSIS — Z79899 Other long term (current) drug therapy: Secondary | ICD-10-CM | POA: Diagnosis not present

## 2017-07-09 DIAGNOSIS — J029 Acute pharyngitis, unspecified: Secondary | ICD-10-CM | POA: Insufficient documentation

## 2017-07-09 DIAGNOSIS — R51 Headache: Secondary | ICD-10-CM | POA: Insufficient documentation

## 2017-07-09 DIAGNOSIS — R42 Dizziness and giddiness: Secondary | ICD-10-CM | POA: Insufficient documentation

## 2017-07-09 LAB — RAPID STREP SCREEN (MED CTR MEBANE ONLY): Streptococcus, Group A Screen (Direct): NEGATIVE

## 2017-07-09 MED ORDER — ACETAMINOPHEN 325 MG PO TABS
650.0000 mg | ORAL_TABLET | Freq: Once | ORAL | Status: AC | PRN
  Administered 2017-07-09: 650 mg via ORAL
  Filled 2017-07-09: qty 2

## 2017-07-09 NOTE — ED Triage Notes (Signed)
Pt states that he has been dizzy spells x 1 day and feels like he is going yo pass out and has had a sore throat 8/10 and extreme dry mouth. Significant other stated that there child at home was dx with hand/foot/mouth last week.

## 2017-07-10 NOTE — Discharge Instructions (Signed)
Take Tylenol or Motrin for any fever or pain. Salt water gargles several times a day. You  can try Chloraseptic throat spray for pain as well. Your rapid strep screen today was negative, cultures pending, if comeback positive we'll call you. Follow-up with family doctor as needed.

## 2017-07-10 NOTE — ED Provider Notes (Signed)
[facility]-EMERGENCY DEPT Provider Note   CSN: [id number] Arrival date & time: 07/09/17  2055     History   Chief Complaint No chief complaint on file.   HPI Nicholas Braun is a 31 y.o. male.  HPI Nicholas Braun is a 31 y.o. male, presents to ED with complaint of sore throat and dizziness. Pt states sore throat started yesterday. States pain with swallowing. Denies Problems swallowing or change in voice. Denies congestion or cough. Had a fever 100.5 early this morning. Has been taking over-the-counter Tylenol and Motrin for pain and fever. Denies any rash. Denies any neck pain or stiffness. Reports mild headache. States he hasn't eaten or drank anything yesterday or today. Patient is drinking and department as he is talking to me. Patient's son has hand-foot-and-mouth disease. Patient denies any oral lesions or rash to his hands or feet.  Past Medical History:  Diagnosis Date  . Asthma     There are no active problems to display for this patient.   History reviewed. No pertinent surgical history.     Home Medications    Prior to Admission medications   Medication Sig Start Date End Date Taking? Authorizing Provider  albuterol (PROVENTIL HFA;VENTOLIN HFA) 108 (90 Base) MCG/ACT inhaler Inhale 1-2 puffs into the lungs every 4 (four) hours as needed for wheezing or shortness of breath. 03/19/17   Gwyneth SproutPlunkett, Whitney, MD  loratadine (CLARITIN) 10 MG tablet Take 10 mg by mouth daily as needed for allergies.    [provider]    Family History History reviewed. No pertinent family history.  Social History Social History  Substance Use Topics  . Smoking status: Never Smoker  . Smokeless tobacco: Never Used  . Alcohol use No     Allergies   Food   Review of Systems Review of Systems  Constitutional: Positive for chills and fever.  HENT: Positive for sore throat. Negative for congestion, ear pain, trouble swallowing and voice change.   Respiratory:  Negative for cough, chest tightness and shortness of breath.   Cardiovascular: Negative for chest pain, palpitations and leg swelling.  Gastrointestinal: Negative for abdominal distention, abdominal pain, diarrhea, nausea and vomiting.  Genitourinary: Negative for dysuria, frequency, hematuria and urgency.  Musculoskeletal: Positive for arthralgias and myalgias. Negative for neck pain and neck stiffness.  Skin: Negative for rash.  Allergic/Immunologic: Negative for immunocompromised state.  Neurological: Positive for dizziness, light-headedness and headaches. Negative for weakness and numbness.  All other systems reviewed and are negative.    Physical Exam Updated Vital Signs BP 109/73 (BP Location: Left Arm)   Pulse (!) 53   Temp 98.8 F (37.1 C) (Oral)   Resp 14   Ht 6\' 7"  (2.007 m)   Wt 51.6 kg (113 lb 11.2 oz)   SpO2 97%   BMI 12.81 kg/m   Physical Exam  Constitutional: He is oriented to person, place, and time. He appears well-developed and well-nourished. No distress.  HENT:  Head: Normocephalic and atraumatic.  Oropharynx is erythematous. Tonsils normal size. Uvula is midline. No exudates.  Eyes: Pupils are equal, round, and reactive to light. Conjunctivae and EOM are normal.  Neck: Normal range of motion. Neck supple.  No meningismus  Cardiovascular: Normal rate, regular rhythm and normal heart sounds.   Pulmonary/Chest: Effort normal. No respiratory distress. He has no wheezes. He has no rales.  Abdominal: Soft. Bowel sounds are normal. He exhibits no distension. There is no tenderness. There is no rebound.  Musculoskeletal: He exhibits no  edema.  Neurological: He is alert and oriented to person, place, and time.  Skin: Skin is warm and dry.  Nursing note and vitals reviewed.    ED Treatments / Results  Labs (all labs ordered are listed, but only abnormal results are displayed) Labs Reviewed  RAPID STREP SCREEN (NOT AT [facility])  CULTURE, GROUP A STREP [facility])     EKG  EKG Interpretation None       Radiology No results found.  Procedures Procedures (including critical care time)  Medications Ordered in ED Medications  acetaminophen (TYLENOL) tablet 650 mg (650 mg Oral Given 07/09/17 2148)     Initial Impression / Assessment and Plan / ED Course  I have reviewed the triage vital signs and the nursing notes.  Pertinent labs & imaging results that were available during my care of the patient were reviewed by me and considered in my medical decision making (see chart for details).     Patient with sore throat and dizziness since yesterday. Rapid strep is negative. Oropharynx is erythematous, otherwise unremarkable exam. No difficulty speaking or swallowing. Vital signs are all within normal. Orthostatics obtained, patient is not orthostatic. Will treat as viral pharyngitis. Symptomatically with oral fluids, salt water gargles, Tylenol Motrin. Follow-up with family doctor as needed. Return precautions discussed.   Vitals:   07/09/17 2110 07/09/17 2140 07/09/17 2337  BP: 111/74  109/73  Pulse: 66  (!) 53  Resp: 18  14  Temp: 99.1 F (37.3 C) 98.8 F (37.1 C)   TempSrc: Oral Oral   SpO2: 97%  97%  Weight: 51.6 kg (113 lb 11.2 oz)    Height: 6\' 7"  (2.007 m)      Final Clinical Impressions(s) / ED Diagnoses   Final diagnoses:  Pharyngitis, unspecified etiology    New Prescriptions New Prescriptions   No medications on file     Jaynie Crumble, Cordelia Poche 07/10/17 0018    Dione Booze, MD 07/10/17 [phone]

## 2017-07-12 LAB — CULTURE, GROUP A STREP (THRC)

## 2017-10-30 ENCOUNTER — Emergency Department (HOSPITAL_COMMUNITY)
Admission: EM | Admit: 2017-10-30 | Discharge: 2017-10-31 | Disposition: A | Discharge status: Home / Self Care | Payer: BLUE CROSS/BLUE SHIELD | Attending: Emergency Medicine | Admitting: Emergency Medicine

## 2017-10-30 ENCOUNTER — Encounter (HOSPITAL_COMMUNITY): Payer: Self-pay | Admitting: Emergency Medicine

## 2017-10-30 DIAGNOSIS — R42 Dizziness and giddiness: Secondary | ICD-10-CM | POA: Diagnosis present

## 2017-10-30 NOTE — ED Notes (Signed)
Bed: [facility] Expected date: 10/30/17 Expected time:  Means of arrival:  Comments:

## 2017-10-30 NOTE — ED Triage Notes (Signed)
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 Pt reports feeling immediately better but was told he had to go to the ER before returning to work. Pt requesting work note.

## 2017-10-31 NOTE — Discharge Instructions (Signed)
Return to the ED if you develop persistent or long lasting dizziness, if you pass out or have chest pain, shortness of breath, palpitations, nausea, vomiting. You may return to work.

## 2017-10-31 NOTE — ED Provider Notes (Signed)
[facility]-EMERGENCY DEPT Provider Note   CSN: [id number] Arrival date & time: 10/30/17  2210     History   Chief Complaint Chief Complaint  Patient presents with  . Work note    HPI Nicholas Braun is a 31 y.o. male w/ h/o asthma presents requesting work note. States he had a brief episode of light-headedness at work lasting less than 20 min. Worse with standing too quickly. Better at reset. Work Production designer, theatre/television/filmmanager gave him a salt tab and water and symptoms resolved. He was planning on staying at work but Production designer, theatre/television/filmmanager asking him for a work note to return to work. He denies associated CP, SOB, nausea, vomiting, palpitations, abdominal or back pain, tinnitus. No recent fluid losses like vomiting or diarrhea. Does not usually keep track of water intake, does not think he drinks enough water. Denies symptoms currently. H/o uncomplicated asthma but no known cardiac problems. No family h/o of WPW or prolonged QT. He does not want any work up in the ED.  HPI  Past Medical History:  Diagnosis Date  . Asthma     There are no active problems to display for this patient.   History reviewed. No pertinent surgical history.     Home Medications    Prior to Admission medications   Medication Sig Start Date End Date Taking? Authorizing Provider  albuterol (PROVENTIL HFA;VENTOLIN HFA) 108 (90 Base) MCG/ACT inhaler Inhale 1-2 puffs into the lungs every 4 (four) hours as needed for wheezing or shortness of breath. 03/19/17   Gwyneth SproutPlunkett, Whitney, MD  loratadine (CLARITIN) 10 MG tablet Take 10 mg by mouth daily as needed for allergies.    [provider]    Family History No family history on file.  Social History Social History   Tobacco Use  . Smoking status: Never Smoker  . Smokeless tobacco: Never Used  Substance Use Topics  . Alcohol use: No  . Drug use: No     Allergies   Food   Review of Systems Review of Systems  Neurological: Positive for  light-headedness (resolved).     Physical Exam Updated Vital Signs BP 134/75 (BP Location: Left Arm)   Pulse 69   Temp 97.9 F (36.6 C) (Oral)   Resp 18   SpO2 100%   Physical Exam  Constitutional: He is oriented to person, place, and time. He appears well-developed and well-nourished. No distress.  NAD.  HENT:  Head: Normocephalic and atraumatic.  Right Ear: External ear normal.  Left Ear: External ear normal.  Nose: Nose normal.  Moist mucous membranes   Eyes: Conjunctivae and EOM are normal. No scleral icterus.  Neck: Normal range of motion. Neck supple.  Cardiovascular: Normal rate, regular rhythm, normal heart sounds and intact distal pulses.  No murmur heard. 2+ radial and DP pulses bilaterally  Pulmonary/Chest: Effort normal and breath sounds normal. He has no wheezes.  Abdominal: Soft. There is no tenderness.  Musculoskeletal: Normal range of motion. He exhibits no deformity.  Neurological: He is alert and oriented to person, place, and time.  No dysarthria. No nystagmus. Strength 5/5 with hand grip and ankle flexion/extension.   Sensation to light touch intact in hands and feet. Steady gait. Negative Romberg. Negative FTN test.  CN I and VIII not tested. CN II-XII intact bilaterally.   Skin: Skin is warm and dry. Capillary refill takes less than 2 seconds.  Psychiatric: He has a normal mood and affect. His behavior is normal. Judgment and thought content normal.  Nursing note and vitals reviewed.    ED Treatments / Results  Labs (all labs ordered are listed, but only abnormal results are displayed) Labs Reviewed - No data to display  EKG  EKG Interpretation None       Radiology No results found.  Procedures Procedures (including critical care time)  Medications Ordered in ED Medications - No data to display   Initial Impression / Assessment and Plan / ED Course  I have reviewed the triage vital signs and the nursing notes.  Pertinent labs &  imaging results that were available during my care of the patient were reviewed by me and considered in my medical decision making (see chart for details).     31 yo w/ asthma presents with resolved, brief episode of light-headedness while at work. Here requesting work note required by Production designer, theatre/television/filmmanager to be able to return to work. History and exam is benign. No associated cardiac symptoms like Cp, SOB, palpitations. He felt better after salt tab and water and has remained asymptomatic since. VS WNL and stable. Higher suspicion for dehydration than cardiac or central nervous system etiology in this healthy 31 yo patient. He reports inadequate oral fluids daily and felt better after drinking water today. No nystagmus or other neuro deficits.  I dont think pt need further emergent lab work or imaging today. Will d/c w/ work note. Discussed return precautions.   Final Clinical Impressions(s) / ED Diagnoses   Final diagnoses:  Light headedness    ED Discharge Orders    None       Liberty HandyGibbons, Woodrow Dulski J, PA-C 10/31/17 [id number]    Zadie RhineWickline, Donald, MD 10/31/17 0700

## 2017-10-31 NOTE — ED Notes (Signed)
Pt ambulatory and independent at discharge.  Verbalized understanding of discharge instructions 

## 2018-07-20 ENCOUNTER — Other Ambulatory Visit: Payer: Self-pay

## 2018-07-20 ENCOUNTER — Encounter (HOSPITAL_COMMUNITY): Payer: Self-pay

## 2018-07-20 DIAGNOSIS — J9801 Acute bronchospasm: Secondary | ICD-10-CM | POA: Diagnosis not present

## 2018-07-20 DIAGNOSIS — R0602 Shortness of breath: Secondary | ICD-10-CM | POA: Diagnosis present

## 2018-07-20 MED ORDER — ALBUTEROL SULFATE (2.5 MG/3ML) 0.083% IN NEBU
5.0000 mg | INHALATION_SOLUTION | Freq: Once | RESPIRATORY_TRACT | Status: AC
  Administered 2018-07-20: 5 mg via RESPIRATORY_TRACT
  Filled 2018-07-20: qty 6

## 2018-07-20 NOTE — ED Triage Notes (Signed)
Pt reports URI symptoms x1 week following similar symptoms from his son. He reports a hx of asthma and states that he needs a new inhaler. Pt reports pain under his right ribs when he coughs. Denies N/V/D/F. A&Ox4. Ambulatory,

## 2018-07-21 ENCOUNTER — Emergency Department (HOSPITAL_COMMUNITY)
Admission: EM | Admit: 2018-07-21 | Discharge: 2018-07-21 | Disposition: A | Discharge status: Home / Self Care | Payer: BLUE CROSS/BLUE SHIELD | Attending: Emergency Medicine | Admitting: Emergency Medicine

## 2018-07-21 DIAGNOSIS — J9801 Acute bronchospasm: Secondary | ICD-10-CM

## 2018-07-21 MED ORDER — ALBUTEROL SULFATE HFA 108 (90 BASE) MCG/ACT IN AERS
2.0000 | INHALATION_SPRAY | Freq: Once | RESPIRATORY_TRACT | Status: AC
  Administered 2018-07-21: 2 via RESPIRATORY_TRACT
  Filled 2018-07-21: qty 6.7

## 2018-07-21 MED ORDER — DEXAMETHASONE 4 MG PO TABS
10.0000 mg | ORAL_TABLET | Freq: Once | ORAL | Status: AC
  Administered 2018-07-21: 10 mg via ORAL
  Filled 2018-07-21: qty 2

## 2018-07-21 MED ORDER — ALBUTEROL SULFATE HFA 108 (90 BASE) MCG/ACT IN AERS: 1.0000 | INHALATION_SPRAY | RESPIRATORY_TRACT | 0 refills | Status: DC | PRN

## 2018-07-21 NOTE — ED Provider Notes (Signed)
[facility]-EMERGENCY DEPT Provider Note   CSN: [id number] Arrival date & time: 07/20/18  2316     History   Chief Complaint Chief Complaint  Patient presents with  . Cough  . Nasal Congestion    HPI Nicholas Braun is a 32 y.o. male.   32 year old male presents to the emergency department for shortness of breath and wheezing over the past week.  Symptoms have been intermittent, worsening.  States that he usually uses his albuterol inhaler, but has been out of this medication recently.  His children have been sick with URI symptoms as well, though he denies any associated nasal congestion, rhinorrhea, fevers, nausea, vomiting, diarrhea.  Believes that his symptoms are secondary to weather changes as he has had similar exacerbations in the past because of this.     Past Medical History:  Diagnosis Date  . Asthma     There are no active problems to display for this patient.   History reviewed. No pertinent surgical history.      Home Medications    Prior to Admission medications   Medication Sig Start Date End Date Taking? Authorizing Provider  albuterol (PROVENTIL HFA;VENTOLIN HFA) 108 (90 Base) MCG/ACT inhaler Inhale 1-2 puffs into the lungs every 4 (four) hours as needed for wheezing or shortness of breath. 07/21/18   Antony MaduraHumes, Augustino Savastano, PA-C  loratadine (CLARITIN) 10 MG tablet Take 10 mg by mouth daily as needed for allergies.    [provider]    Family History History reviewed. No pertinent family history.  Social History Social History   Tobacco Use  . Smoking status: Never Smoker  . Smokeless tobacco: Never Used  Substance Use Topics  . Alcohol use: No  . Drug use: No     Allergies   Food   Review of Systems Review of Systems Ten systems reviewed and are negative for acute change, except as noted in the HPI.    Physical Exam Updated Vital Signs BP (!) 114/92 (BP Location: Right Arm)   Pulse 88   Temp 98 F  (36.7 C) (Oral)   Resp 16   SpO2 98%   Physical Exam  Constitutional: He is oriented to person, place, and time. He appears well-developed and well-nourished. No distress.  Nontoxic appearing and in NAD  HENT:  Head: Normocephalic and atraumatic.  Eyes: Conjunctivae and EOM are normal. No scleral icterus.  Neck: Normal range of motion.  Cardiovascular: Normal rate, regular rhythm and intact distal pulses.  Pulmonary/Chest: Effort normal. No stridor. No respiratory distress. He has no wheezes. He has no rales.  Lungs CTAB. Respirations even and unlabored.  Musculoskeletal: Normal range of motion.  Neurological: He is alert and oriented to person, place, and time. He exhibits normal muscle tone. Coordination normal.  Ambulatory with steady gait.  Skin: Skin is warm and dry. No rash noted. He is not diaphoretic. No erythema. No pallor.  Psychiatric: He has a normal mood and affect. His behavior is normal.  Nursing note and vitals reviewed.    ED Treatments / Results  Labs (all labs ordered are listed, but only abnormal results are displayed) Labs Reviewed - No data to display  EKG None  Radiology No results found.  Procedures Procedures (including critical care time)  Medications Ordered in ED Medications  albuterol (PROVENTIL) (2.5 MG/3ML) 0.083% nebulizer solution 5 mg (5 mg Nebulization Given 07/20/18 2334)  dexamethasone (DECADRON) tablet 10 mg (10 mg Oral Given 07/21/18 0200)  albuterol (PROVENTIL HFA;VENTOLIN HFA)  108 (90 Base) MCG/ACT inhaler 2 puff (2 puffs Inhalation Given 07/21/18 0200)     Initial Impression / Assessment and Plan / ED Course  I have reviewed the triage vital signs and the nursing notes.  Pertinent labs & imaging results that were available during my care of the patient were reviewed by me and considered in my medical decision making (see chart for details).     32 year old male presenting for symptoms consistent with acute bronchospasm 2/2  seasonal allergies.  Denies any associated congestion, rhinorrhea, fevers.  No tachypnea, dyspnea, hypoxia to suggest pneumonia.  Lungs are clear following DuoNeb treatment in triage.  Patient states that he is currently breathing at baseline.  Do not see indication for further emergent work-up at this time.  Patient given a tablet of Decadron prior to discharge.  Will also provide patient with new inhaler.  Return precautions discussed and provided.  Patient discharged in stable condition with no unaddressed concerns.  Vitals:   07/20/18 2320 07/21/18 0201  BP: 117/81 (!) 114/92  Pulse: 60 88  Resp: 18 16  Temp: 98 F (36.7 C)   TempSrc: Oral   SpO2: 99% 98%    Final Clinical Impressions(s) / ED Diagnoses   Final diagnoses:  Bronchospasm, acute    ED Discharge Orders         Ordered    albuterol (PROVENTIL HFA;VENTOLIN HFA) 108 (90 Base) MCG/ACT inhaler  Every 4 hours PRN     07/21/18 0151           [name], PA-C 07/21/18 0214    Dione Booze, MD 07/21/18 [phone]

## 2018-07-21 NOTE — Discharge Instructions (Signed)
Continue use of Zyrtec or Claritin daily.  You may use 2 puffs of your albuterol inhaler every 4-6 hours as needed for cough, wheezing, shortness of breath.  Follow-up with your primary care doctor.  You may return for new or concerning symptoms.

## 2018-08-11 ENCOUNTER — Encounter (HOSPITAL_COMMUNITY): Payer: Self-pay | Admitting: *Deleted

## 2018-08-11 ENCOUNTER — Other Ambulatory Visit: Payer: Self-pay

## 2018-08-11 ENCOUNTER — Emergency Department (HOSPITAL_COMMUNITY)
Admission: EM | Admit: 2018-08-11 | Discharge: 2018-08-12 | Disposition: A | Discharge status: Home / Self Care | Payer: BLUE CROSS/BLUE SHIELD | Attending: Emergency Medicine | Admitting: Emergency Medicine

## 2018-08-11 DIAGNOSIS — R197 Diarrhea, unspecified: Secondary | ICD-10-CM | POA: Diagnosis not present

## 2018-08-11 DIAGNOSIS — R1032 Left lower quadrant pain: Secondary | ICD-10-CM | POA: Diagnosis not present

## 2018-08-11 DIAGNOSIS — J45909 Unspecified asthma, uncomplicated: Secondary | ICD-10-CM | POA: Diagnosis not present

## 2018-08-11 DIAGNOSIS — R109 Unspecified abdominal pain: Secondary | ICD-10-CM

## 2018-08-11 LAB — COMPREHENSIVE METABOLIC PANEL
ALBUMIN: 4.3 g/dL (ref 3.5–5.0)
ALT: 18 U/L (ref 0–44)
ANION GAP: 8 (ref 5–15)
AST: 22 U/L (ref 15–41)
Alkaline Phosphatase: 53 U/L (ref 38–126)
BILIRUBIN TOTAL: 1.1 mg/dL (ref 0.3–1.2)
BUN: 11 mg/dL (ref 6–20)
CO2: 29 mmol/L (ref 22–32)
Calcium: 8.9 mg/dL (ref 8.9–10.3)
Chloride: 103 mmol/L (ref 98–111)
Creatinine, Ser: 1.11 mg/dL (ref 0.61–1.24)
GFR calc Af Amer: >60 mL/min (ref >60)
GFR calc non Af Amer: >60 mL/min (ref >60)
GLUCOSE: 90 mg/dL (ref 70–99)
POTASSIUM: 3.5 mmol/L (ref 3.5–5.1)
SODIUM: 140 mmol/L (ref 135–145)
TOTAL PROTEIN: 7.3 g/dL (ref 6.5–8.1)

## 2018-08-11 LAB — URINALYSIS, ROUTINE W REFLEX MICROSCOPIC
Bilirubin Urine: NEGATIVE
GLUCOSE, UA: NEGATIVE mg/dL
HGB URINE DIPSTICK: NEGATIVE
KETONES UR: NEGATIVE mg/dL
Leukocytes, UA: NEGATIVE
Nitrite: NEGATIVE
PH: 5 (ref 5.0–8.0)
Protein, ur: NEGATIVE mg/dL
SPECIFIC GRAVITY, URINE: 1.03 (ref 1.005–1.030)

## 2018-08-11 LAB — CBC
HEMATOCRIT: 39.9 % (ref 39.0–52.0)
Hemoglobin: 13.2 g/dL (ref 13.0–17.0)
MCH: 29.7 pg (ref 26.0–34.0)
MCHC: 33.1 g/dL (ref 30.0–36.0)
MCV: 89.9 fL (ref 78.0–100.0)
Platelets: 279 10*3/uL (ref 150–400)
RBC: 4.44 MIL/uL (ref 4.22–5.81)
RDW: 12.4 % (ref 11.5–15.5)
WBC: 6.1 10*3/uL (ref 4.0–10.5)

## 2018-08-11 LAB — LIPASE, BLOOD: Lipase: 69 U/L — ABNORMAL HIGH (ref 11–51)

## 2018-08-11 MED ORDER — ONDANSETRON 4 MG PO TBDP: 4.0000 mg | ORAL_TABLET | Freq: Three times a day (TID) | ORAL | 0 refills | Status: AC | PRN

## 2018-08-11 MED ORDER — DICYCLOMINE HCL 10 MG PO CAPS
20.0000 mg | ORAL_CAPSULE | Freq: Once | ORAL | Status: AC
  Administered 2018-08-11: 20 mg via ORAL
  Filled 2018-08-11: qty 2

## 2018-08-11 MED ORDER — ONDANSETRON 4 MG PO TBDP
4.0000 mg | ORAL_TABLET | Freq: Once | ORAL | Status: AC
  Administered 2018-08-11: 4 mg via ORAL
  Filled 2018-08-11: qty 1

## 2018-08-11 MED ORDER — DICYCLOMINE HCL 20 MG PO TABS: 20.0000 mg | ORAL_TABLET | Freq: Two times a day (BID) | ORAL | 0 refills | Status: AC

## 2018-08-11 NOTE — ED Triage Notes (Signed)
Pt reports left sided abdominal cramping since Friday. He has had mucous in his stool. No n/v/fevers. Last took imodium today around noon.

## 2018-08-11 NOTE — Discharge Instructions (Signed)
Take the prescribed medication as directed.  Can continue imodium as well.  Good oral hydration.  Follow BRAT diet. Follow-up with a primary care clinic in the area. Return to the ED for new or worsening symptoms.

## 2018-08-11 NOTE — ED Provider Notes (Signed)
[facility]-EMERGENCY DEPT Provider Note   CSN: [id number] Arrival date & time: 08/11/18  1924     History   Chief Complaint Chief Complaint  Patient presents with  . Abdominal Pain    HPI Nicholas Braun is a 32 y.o. male.  The history is provided by the patient and medical records.  Abdominal Pain   Associated symptoms include diarrhea.     32 year old male presenting to the ED with abdominal pain.  States this began on Friday, 2 days ago.  States intermittent abdominal pain in his left lower abdomen which he describes as variable between sharp and cramping.  States he gets better initially after bowel movement but then pain will return.  States stool is non-bloody, but does have mucous in it.  states he has been able to eat and drink, mostly has been eating [employer] this weekend.  He denies any fever or chills.  No vomiting.  No GI history.  Took some imodium which worked temporarily.  No diarrhea since arrival in ED.  No recent travel or abx use.  No changes in diet.  Past Medical History:  Diagnosis Date  . Asthma     There are no active problems to display for this patient.   History reviewed. No pertinent surgical history.      Home Medications    Prior to Admission medications   Medication Sig Start Date End Date Taking? Authorizing Provider  albuterol (PROVENTIL HFA;VENTOLIN HFA) 108 (90 Base) MCG/ACT inhaler Inhale 1-2 puffs into the lungs every 4 (four) hours as needed for wheezing or shortness of breath. 07/21/18  Yes Antony MaduraHumes, Kelly, PA-C  loperamide (IMODIUM A-D) 2 MG tablet Take 2 mg by mouth 4 (four) times daily as needed for diarrhea or loose stools.   Yes [provider]  loratadine (CLARITIN) 10 MG tablet Take 10 mg by mouth daily as needed for allergies.   Yes [provider]    Family History No family history on file.  Social History Social History   Tobacco Use  . Smoking status: Never Smoker  .  Smokeless tobacco: Never Used  Substance Use Topics  . Alcohol use: No  . Drug use: No     Allergies   Food   Review of Systems Review of Systems  Gastrointestinal: Positive for abdominal pain and diarrhea.  All other systems reviewed and are negative.    Physical Exam Updated Vital Signs BP 110/69 (BP Location: Right Arm)   Pulse (!) 51   Temp 98.4 F (36.9 C) (Oral)   Resp 14   SpO2 100%   Physical Exam  Constitutional: He is oriented to person, place, and time. He appears well-developed and well-nourished.  Lying in bed on left side, NAD  HENT:  Head: Normocephalic and atraumatic.  Mouth/Throat: Oropharynx is clear and moist.  Eyes: Pupils are equal, round, and reactive to light. Conjunctivae and EOM are normal.  Neck: Normal range of motion.  Cardiovascular: Normal rate, regular rhythm and normal heart sounds.  Pulmonary/Chest: Effort normal and breath sounds normal.  Abdominal: Soft. Bowel sounds are normal. There is no tenderness. There is no rigidity and no guarding.  Musculoskeletal: Normal range of motion.  Neurological: He is alert and oriented to person, place, and time.  Skin: Skin is warm and dry.  Psychiatric: He has a normal mood and affect.  Nursing note and vitals reviewed.    ED Treatments / Results  Labs (all labs ordered are listed, but  only abnormal results are displayed) Labs Reviewed  LIPASE, BLOOD - Abnormal; Notable for the following components:      Result Value   Lipase 69 (*)    All other components within normal limits  COMPREHENSIVE METABOLIC PANEL  CBC  URINALYSIS, ROUTINE W REFLEX MICROSCOPIC    EKG None  Radiology No results found.  Procedures Procedures (including critical care time)  Medications Ordered in ED Medications  dicyclomine (BENTYL) capsule 20 mg (20 mg Oral Given 08/11/18 2322)  ondansetron (ZOFRAN-ODT) disintegrating tablet 4 mg (4 mg Oral Given 08/11/18 2322)     Initial Impression / Assessment  and Plan / ED Course  I have reviewed the triage vital signs and the nursing notes.  Pertinent labs & imaging results that were available during my care of the patient were reviewed by me and considered in my medical decision making (see chart for details).  32 y.o. M here with abdominal pain and diarrhea for the past 2 days.  Reports some intermittent abdominal cramping and sharp pain.  He is afebrile, non-toxic.  Abdomen soft, benign.  Labs overall reassuring.  Possibly viral etiology, doubt acute/surgical pathology.  Discussed symptomatic care continued Imodium, will add Bentyl and Zofran.  Recommended gentle brat diet for now, progress back to normal as tolerated.  Patient does not currently have PCP but is insured, given instructions on how to establish care.  He will return here for any new or worsening symptoms.  Final Clinical Impressions(s) / ED Diagnoses   Final diagnoses:  Abdominal cramping  Diarrhea, unspecified type    ED Discharge Orders         Ordered    dicyclomine (BENTYL) 20 MG tablet  2 times daily     08/11/18 2323    ondansetron (ZOFRAN ODT) 4 MG disintegrating tablet  Every 8 hours PRN     08/11/18 2323           Garlon Hatchet, PA-C 08/11/18 2326    Melene Plan, DO 08/11/18 2345

## 2019-07-21 ENCOUNTER — Emergency Department (HOSPITAL_COMMUNITY): Payer: BC Managed Care – PPO

## 2019-07-21 ENCOUNTER — Encounter (HOSPITAL_COMMUNITY): Payer: Self-pay | Admitting: Emergency Medicine

## 2019-07-21 ENCOUNTER — Other Ambulatory Visit: Payer: Self-pay

## 2019-07-21 ENCOUNTER — Emergency Department (HOSPITAL_COMMUNITY)
Admission: EM | Admit: 2019-07-21 | Discharge: 2019-07-21 | Disposition: A | Discharge status: Home / Self Care | Payer: BC Managed Care – PPO | Attending: Emergency Medicine | Admitting: Emergency Medicine

## 2019-07-21 DIAGNOSIS — Y939 Activity, unspecified: Secondary | ICD-10-CM | POA: Diagnosis not present

## 2019-07-21 DIAGNOSIS — Y999 Unspecified external cause status: Secondary | ICD-10-CM | POA: Diagnosis not present

## 2019-07-21 DIAGNOSIS — W010XXA Fall on same level from slipping, tripping and stumbling without subsequent striking against object, initial encounter: Secondary | ICD-10-CM | POA: Insufficient documentation

## 2019-07-21 DIAGNOSIS — J45909 Unspecified asthma, uncomplicated: Secondary | ICD-10-CM | POA: Diagnosis not present

## 2019-07-21 DIAGNOSIS — Y929 Unspecified place or not applicable: Secondary | ICD-10-CM | POA: Diagnosis not present

## 2019-07-21 DIAGNOSIS — S6991XA Unspecified injury of right wrist, hand and finger(s), initial encounter: Secondary | ICD-10-CM | POA: Insufficient documentation

## 2019-07-21 MED ORDER — NAPROXEN 500 MG PO TABS: 500.0000 mg | ORAL_TABLET | Freq: Two times a day (BID) | ORAL | 0 refills | Status: AC

## 2019-07-21 NOTE — Discharge Instructions (Addendum)
Please read and follow all provided instructions.  You have been seen today for right wrist/hand pain after an injury.   Tests performed today include: An x-ray of the affected area - does NOT show any broken bones or dislocations.  Vital signs. See below for your results today.   Home care instructions: -- *PRICE in the first 24-48 hours after injury: Protect (with brace, splint, sling), if given by your provider Rest Ice- Do not apply ice pack directly to your skin, place towel or similar between your skin and ice/ice pack. Apply ice for 20 min, then remove for 40 min while awake Compression- Wear brace, elastic bandage, splint as directed by your provider Elevate affected extremity above the level of your heart when not walking around for the first 24-48 hours   Medications:  - Naproxen is a nonsteroidal anti-inflammatory medication that will help with pain and swelling. Be sure to take this medication as prescribed with food, 1 pill every 12 hours,  It should be taken with food, as it can cause stomach upset, and more seriously, stomach bleeding. Do not take other nonsteroidal anti-inflammatory medications with this such as Advil, Motrin, Aleve, Mobic, Goodie Powder, or Motrin.   You make take Tylenol per over the counter dosing with these medications.   We have prescribed you new medication(s) today. Discuss the medications prescribed today with your pharmacist as they can have adverse effects and interactions with your other medicines including over the counter and prescribed medications. Seek medical evaluation if you start to experience new or abnormal symptoms after taking one of these medicines, seek care immediately if you start to experience difficulty breathing, feeling of your throat closing, facial swelling, or rash as these could be indications of a more serious allergic reaction   Follow-up instructions: Please follow-up with your primary care provider or the provided  orthopedic physician (bone specialist) if you continue to have significant pain in 1 week. In this case you may have a more severe injury that requires further care.   Return instructions:  Please return if your digits or extremity are numb or tingling, appear gray or blue, or you have severe pain (also elevate the extremity and loosen splint or wrap if you were given one) Please return if you have redness or fevers.  Please return to the Emergency Department if you experience worsening symptoms.  Please return if you have any other emergent concerns. Additional Information:  Your vital signs today were: BP 127/79 (BP Location: Left Arm)    Pulse 89    Temp 98.8 F (37.1 C) (Oral)    Resp 15    Ht 6\' 7"  (2.007 m)    Wt 74.8 kg    SpO2 99%    BMI 18.59 kg/m  If your blood pressure (BP) was elevated above 135/85 this visit, please have this repeated by your doctor within one month. ---------------

## 2019-07-21 NOTE — ED Provider Notes (Signed)
[facility]-EMERGENCY DEPT Provider Note   CSN: [id number] Arrival date & time: 07/21/19  1148     History   Chief Complaint Chief Complaint  Patient presents with  . Fall  . Hand Pain    HPI Nicholas Braun is a 33 y.o. male with a hx of asthma who presents to the ED w/ complaints of R wrist/hand pain s/p mechanical fall 2 days prior. Patient states he was walking when he tripped over something and fell onto his R hand with his wrist in a flexed position. Denies head injury or LOC. Reports isolated injury to R hand/wrist. States the area is painful, 8/10 in severity, worse with movement, improved with rest & tylenol somewhat. Denies numbness, tingling, or weakness. Patient is R hand dominant.      HPI  Past Medical History:  Diagnosis Date  . Asthma     There are no active problems to display for this patient.   History reviewed. No pertinent surgical history.      Home Medications    Prior to Admission medications   Medication Sig Start Date End Date Taking? Authorizing Provider  albuterol (PROVENTIL HFA;VENTOLIN HFA) 108 (90 Base) MCG/ACT inhaler Inhale 1-2 puffs into the lungs every 4 (four) hours as needed for wheezing or shortness of breath. 07/21/18   Antony MaduraHumes, Kelly, PA-C  dicyclomine (BENTYL) 20 MG tablet Take 1 tablet (20 mg total) by mouth 2 (two) times daily. 08/11/18   Garlon HatchetSanders, Lisa M, PA-C  loperamide (IMODIUM A-D) 2 MG tablet Take 2 mg by mouth 4 (four) times daily as needed for diarrhea or loose stools.    [provider]  loratadine (CLARITIN) 10 MG tablet Take 10 mg by mouth daily as needed for allergies.    [provider]  ondansetron (ZOFRAN ODT) 4 MG disintegrating tablet Take 1 tablet (4 mg total) by mouth every 8 (eight) hours as needed for nausea. 08/11/18   Garlon HatchetSanders, Lisa M, PA-C    Family History No family history on file.  Social History Social History   Tobacco Use  . Smoking status: Never Smoker   . Smokeless tobacco: Never Used  Substance Use Topics  . Alcohol use: No  . Drug use: No     Allergies   Food   Review of Systems Review of Systems  Constitutional: Negative for chills and fever.  Respiratory: Negative for shortness of breath.   Cardiovascular: Negative for chest pain.  Musculoskeletal: Positive for arthralgias. Negative for back pain and neck pain.  Neurological: Negative for weakness and numbness.     Physical Exam Updated Vital Signs BP 127/79 (BP Location: Left Arm)   Pulse 89   Temp 98.8 F (37.1 C) (Oral)   Resp 15   Ht 6\' 7"  (2.007 m)   Wt 74.8 kg   SpO2 99%   BMI 18.59 kg/m   Physical Exam Vitals signs and nursing note reviewed.  Constitutional:      General: He is not in acute distress.    Appearance: Normal appearance. He is not ill-appearing or toxic-appearing.  HENT:     Head: Normocephalic and atraumatic.  Neck:     Musculoskeletal: Normal range of motion and neck supple.     Comments: No midline tenderness.  Cardiovascular:     Rate and Rhythm: Normal rate.     Pulses:          Radial pulses are 2+ on the right side and 2+ on the left side.  Pulmonary:     Effort: No respiratory distress.     Breath sounds: Normal breath sounds.  Musculoskeletal:     Comments: Upper extremities: No obvious deformity, appreciable swelling, edema, erythema, ecchymosis, warmth, or open wounds. Patient has intact AROM throughout. Tender to palpation to the 3rd/4th MCP, metacarpals, carpals just proximal to 3rd/4th metacarpal region extending to the dorsal wrist of the RUE. Otherwise nontender. No anatomical snuffbox tenderness.   Skin:    General: Skin is warm and dry.     Capillary Refill: Capillary refill takes less than 2 seconds.  Neurological:     Mental Status: He is alert.     Comments: Alert. Clear speech. Sensation grossly intact to bilateral upper extremities. 5/5 symmetric grip strength. Ambulatory.   Psychiatric:        Mood and  Affect: Mood normal.        Behavior: Behavior normal.    ED Treatments / Results  Labs (all labs ordered are listed, but only abnormal results are displayed) Labs Reviewed - No data to display  EKG None  Radiology Dg Hand Complete Right  Result Date: 07/21/2019 CLINICAL DATA:  33 year old who fell and injured the RIGHT hand 2 days ago, persistent pain localizing to the third MCP joint. Initial encounter. EXAM: RIGHT HAND - COMPLETE 3+ VIEW COMPARISON:  None. FINDINGS: No evidence of acute fracture or dislocation. Joint spaces well preserved. Well-preserved bone mineral density. No intrinsic osseous abnormalities. IMPRESSION: Normal examination. Electronically Signed   By: Evangeline Dakin M.D.   On: 07/21/2019 12:42    Procedures Procedures (including critical care time) SPLINT APPLICATION Date/Time: 1:75 PM Authorized by: Kennith Maes Consent: Verbal consent obtained. Risks and benefits: risks, benefits and alternatives were discussed Consent given by: patient Splint applied by: orthopedic technician Location details: RUE Splint type: wrist brace Supplies used: wrist brace Post-procedure: The splinted body part was neurovascularly unchanged following the procedure. Patient tolerance: Patient tolerated the procedure well with no immediate complications.   Medications Ordered in ED Medications - No data to display   Initial Impression / Assessment and Plan / ED Course  I have reviewed the triage vital signs and the nursing notes.  Pertinent labs & imaging results that were available during my care of the patient were reviewed by me and considered in my medical decision making (see chart for details).    Patient presents to the ED with complaints of pain to the  R wrist/hand pain s/p injury 2 days prior. Exam without obvious deformity or open wounds. ROM intact. Tender to palpation no 3rd/4th MCP/metacarapls/carpas just proximal to this area/dorsal wrist. NVI  distally. Xray negative for fracture/dislocation. Therapeutic splint provided. PRICE recommended. Naproxen prescription. I discussed results, treatment plan, need for follow-up, and return precautions with the patient. Provided opportunity for questions, patient confirmed understanding and are in agreement with plan.    Final Clinical Impressions(s) / ED Diagnoses   Final diagnoses:  Injury of right wrist, initial encounter    ED Discharge Orders         Ordered    naproxen (NAPROSYN) 500 MG tablet  2 times daily     07/21/19 [address], PA-C 07/21/19 1408    Valarie Merino, MD 07/28/19 [phone]

## 2019-07-21 NOTE — ED Triage Notes (Signed)
Pt reports fell on Saturday and landed on right hand. C/o pains

## 2019-12-17 ENCOUNTER — Other Ambulatory Visit: Payer: BC Managed Care – PPO

## 2020-07-14 ENCOUNTER — Emergency Department (HOSPITAL_COMMUNITY)
Admission: EM | Admit: 2020-07-14 | Discharge: 2020-07-14 | Disposition: A | Discharge status: Home / Self Care | Payer: BC Managed Care – PPO | Attending: Emergency Medicine | Admitting: Emergency Medicine

## 2020-07-14 ENCOUNTER — Other Ambulatory Visit: Payer: Self-pay

## 2020-07-14 ENCOUNTER — Encounter (HOSPITAL_COMMUNITY): Payer: Self-pay | Admitting: Emergency Medicine

## 2020-07-14 DIAGNOSIS — Z5321 Procedure and treatment not carried out due to patient leaving prior to being seen by health care provider: Secondary | ICD-10-CM | POA: Insufficient documentation

## 2020-07-14 DIAGNOSIS — H6121 Impacted cerumen, right ear: Secondary | ICD-10-CM | POA: Diagnosis not present

## 2020-07-14 NOTE — ED Triage Notes (Signed)
Patient states that last night he was watching TV and noticed he could not hear well from his R ear, states it feels swimmy. Ceremun noted upon inspection.

## 2020-11-29 ENCOUNTER — Other Ambulatory Visit: Payer: BC Managed Care – PPO

## 2020-11-29 DIAGNOSIS — Z20822 Contact with and (suspected) exposure to covid-19: Secondary | ICD-10-CM

## 2020-11-30 LAB — SARS-COV-2, NAA 2 DAY TAT

## 2020-11-30 LAB — NOVEL CORONAVIRUS, NAA: SARS-CoV-2, NAA: NOT DETECTED

## 2021-02-25 IMAGING — CR RIGHT HAND - COMPLETE 3+ VIEW
3 series · 3 of 3 positions shown · non-contrast
Comparison: None.

CLINICAL DATA: 32-year-old who fell and injured the RIGHT hand 2
days ago, persistent pain localizing to the third MCP joint. Initial
encounter.

EXAM:
RIGHT HAND - COMPLETE 3+ VIEW

[x hand pa right]
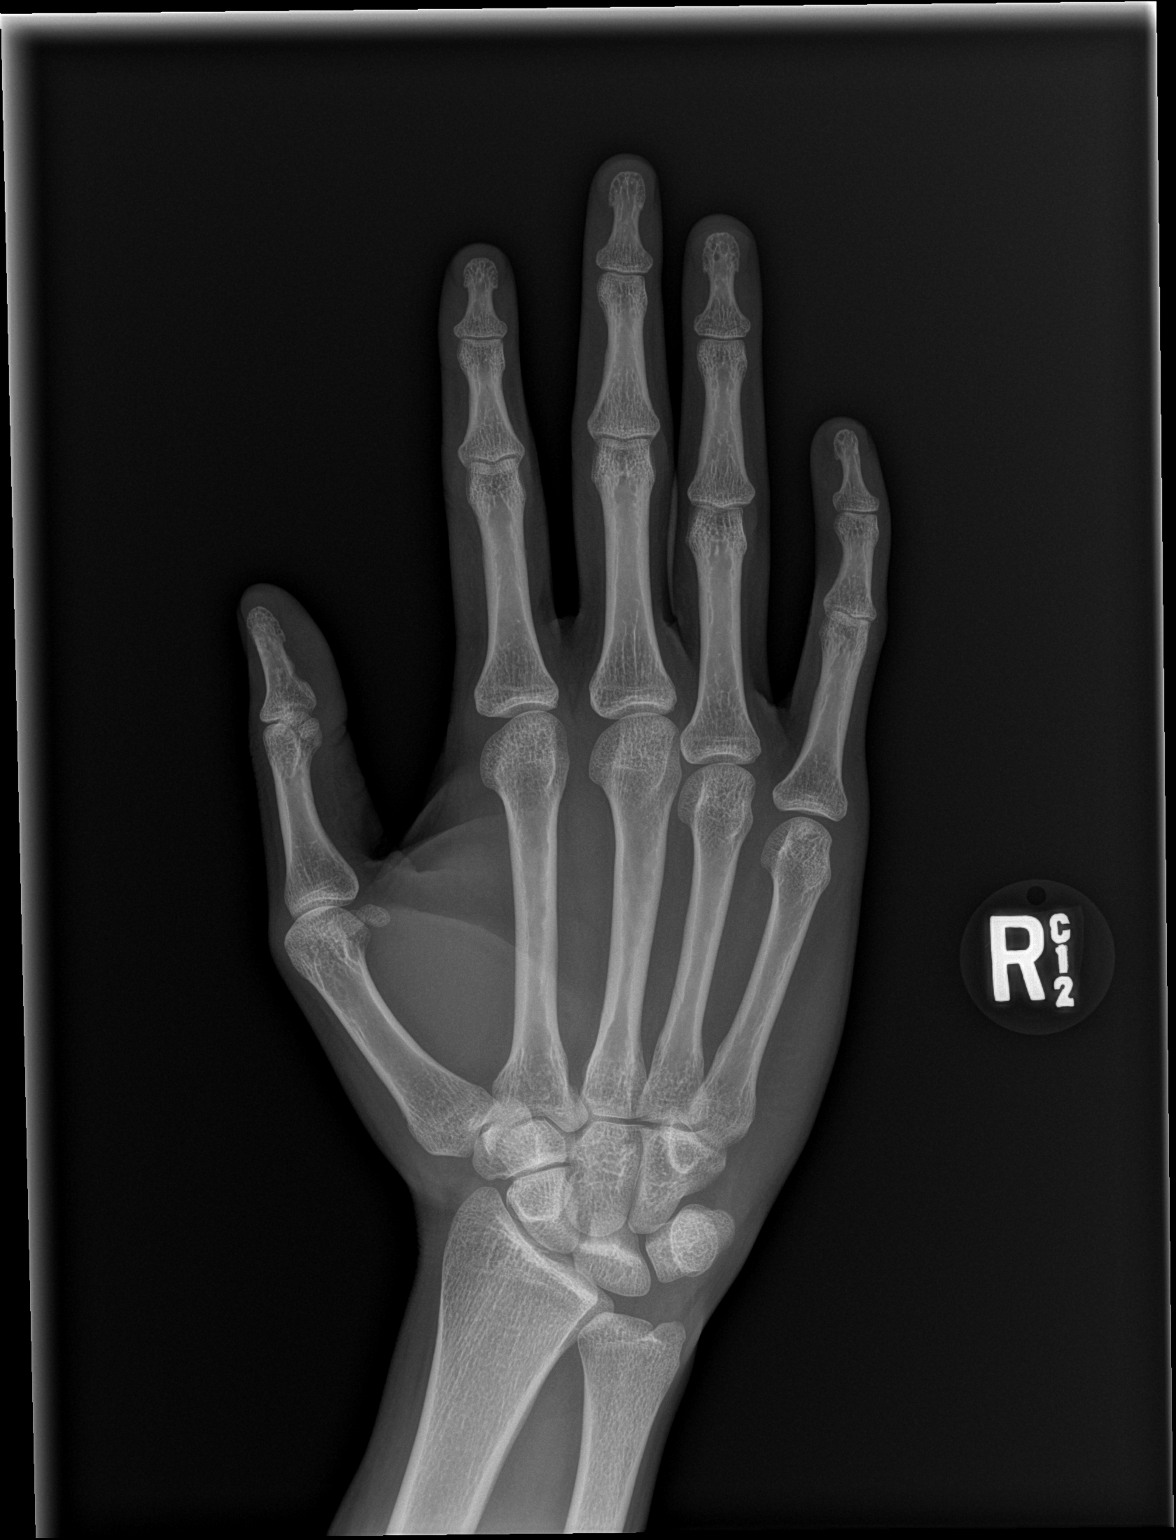

[x hand obl right]
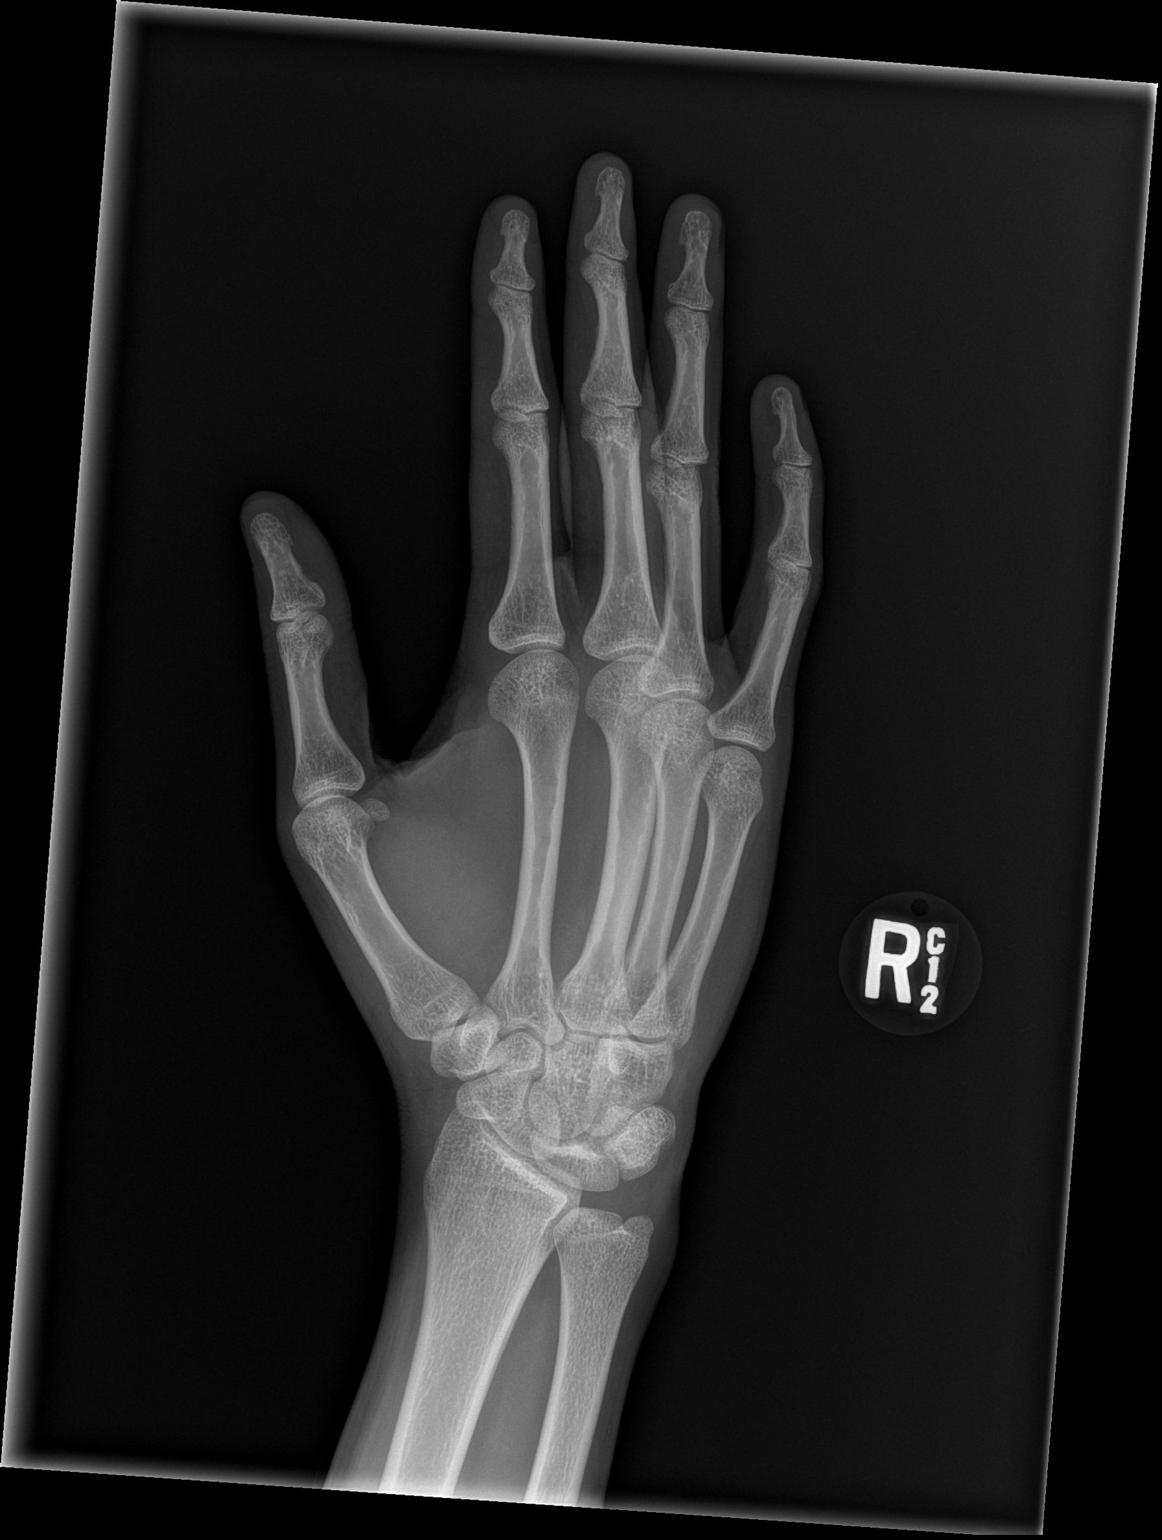

[x hand lat right]
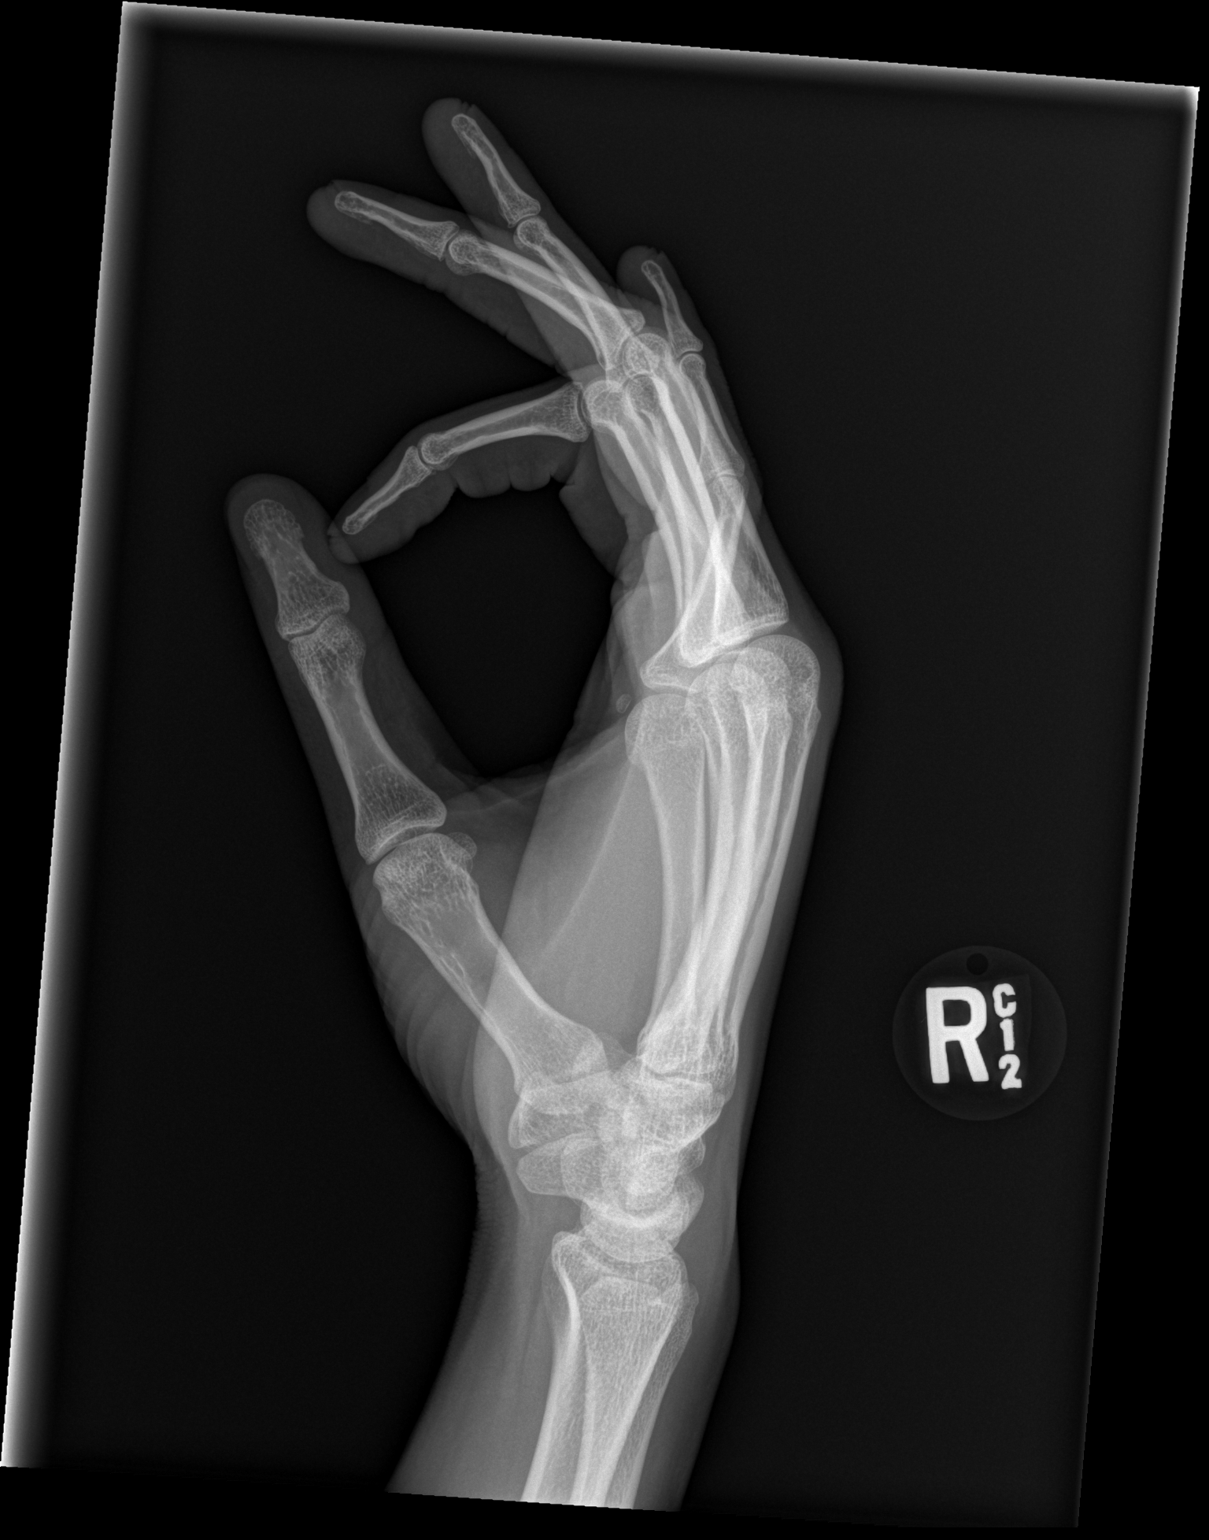

[3 of 3 positions shown; findings below may reference images not displayed]

FINDINGS: No evidence of acute fracture or dislocation. Joint spaces well
preserved. Well-preserved bone mineral density. No intrinsic osseous
abnormalities.
IMPRESSION: Normal examination.

## 2022-01-20 ENCOUNTER — Telehealth: Payer: 59 | Admitting: Physician Assistant

## 2022-01-20 DIAGNOSIS — J4520 Mild intermittent asthma, uncomplicated: Secondary | ICD-10-CM | POA: Diagnosis not present

## 2022-01-20 MED ORDER — ALBUTEROL SULFATE HFA 108 (90 BASE) MCG/ACT IN AERS: 1.0000 | INHALATION_SPRAY | RESPIRATORY_TRACT | 0 refills | Status: DC | PRN

## 2022-01-20 NOTE — Progress Notes (Signed)
Virtual Visit Consent   Nicholas Braun, you are scheduled for a virtual visit with a [facility] provider today.     Just as with appointments in the office, your consent must be obtained to participate.  Your consent will be active for this visit and any virtual visit you may have with one of our providers in the next 365 days.     If you have a MyChart account, a copy of this consent can be sent to you electronically.  All virtual visits are billed to your insurance company just like a traditional visit in the office.    As this is a virtual visit, video technology does not allow for your provider to perform a traditional examination.  This may limit your provider's ability to fully assess your condition.  If your provider identifies any concerns that need to be evaluated in person or the need to arrange testing (such as labs, EKG, etc.), we will make arrangements to do so.     Although advances in technology are sophisticated, we cannot ensure that it will always work on either your end or our end.  If the connection with a video visit is poor, the visit may have to be switched to a telephone visit.  With either a video or telephone visit, we are not always able to ensure that we have a secure connection.     I need to obtain your verbal consent now.   Are you willing to proceed with your visit today?    Nicholas Braun has provided verbal consent on 01/20/2022 for a virtual visit (video or telephone).   Margaretann Loveless, PA-C   Date: 01/20/2022 5:58 PM   Virtual Visit via Video Note   I, Margaretann Loveless, connected with  Nicholas Braun  ([id number], [birth year]) on 01/20/22 at  6:00 PM EST by a video-enabled telemedicine application and verified that I am speaking with the correct person using two identifiers.  Location: Patient: Virtual Visit Location Patient: Mobile Provider: Virtual Visit Location Provider: Home Office   I discussed the limitations of  evaluation and management by telemedicine and the availability of in person appointments. The patient expressed understanding and agreed to proceed.    History of Present Illness: Nicholas Braun is a 36 y.o. who identifies as a male who was assigned male at birth, and is being seen today for a refill on his albuterol inhaler. Reports he had run out of refills on his prescription and needed a new inhaler before allergy season. The pharmacy has reached out to the original prescribing provider but there has been no response at this time. He is not having any acute symptoms at this time.  Problems: There are no problems to display for this patient.   Allergies:  Allergies  Allergen Reactions   Food Hives, Swelling and Other (See Comments)    Pt states that he is allergic to ranch dressing.     Medications:  Current Outpatient Medications:    albuterol (VENTOLIN HFA) 108 (90 Base) MCG/ACT inhaler, Inhale 1-2 puffs into the lungs every 4 (four) hours as needed for wheezing or shortness of breath., Disp: 18 g, Rfl: 0   dicyclomine (BENTYL) 20 MG tablet, Take 1 tablet (20 mg total) by mouth 2 (two) times daily., Disp: 20 tablet, Rfl: 0   loperamide (IMODIUM A-D) 2 MG tablet, Take 2 mg by mouth 4 (four) times daily as needed for diarrhea or loose stools., Disp: , Rfl:  loratadine (CLARITIN) 10 MG tablet, Take 10 mg by mouth daily as needed for allergies., Disp: , Rfl:    naproxen (NAPROSYN) 500 MG tablet, Take 1 tablet (500 mg total) by mouth 2 (two) times daily., Disp: 10 tablet, Rfl: 0   ondansetron (ZOFRAN ODT) 4 MG disintegrating tablet, Take 1 tablet (4 mg total) by mouth every 8 (eight) hours as needed for nausea., Disp: 10 tablet, Rfl: 0  Observations/Objective: Patient is well-developed, well-nourished in no acute distress.  Resting comfortably  Head is normocephalic, atraumatic.  No labored breathing.  Speech is clear and coherent with logical content.  Patient is alert and  oriented at baseline.    Assessment and Plan: 1. Mild intermittent asthma without complication - albuterol (VENTOLIN HFA) 108 (90 Base) MCG/ACT inhaler; Inhale 1-2 puffs into the lungs every 4 (four) hours as needed for wheezing or shortness of breath.  Dispense: 18 g; Refill: 0  - Albuterol refilled - Follow up with PCP office or prescribing provider for long term refills  Follow Up Instructions: I discussed the assessment and treatment plan with the patient. The patient was provided an opportunity to ask questions and all were answered. The patient agreed with the plan and demonstrated an understanding of the instructions.  A copy of instructions were sent to the patient via MyChart unless otherwise noted below.   The patient was advised to call back or seek an in-person evaluation if the symptoms worsen or if the condition fails to improve as anticipated.  Time:  I spent 8 minutes with the patient via telehealth technology discussing the above problems/concerns.    Margaretann Loveless, PA-C

## 2022-01-20 NOTE — Patient Instructions (Signed)
°  Nydia Bouton, thank you for joining Margaretann Loveless, PA-C for today's virtual visit.  While this provider is not your primary care provider (PCP), if your PCP is located in our provider database this encounter information will be shared with them immediately following your visit.  Consent: (Patient) Nydia Bouton provided verbal consent for this virtual visit at the beginning of the encounter.  Current Medications:  Current Outpatient Medications:    albuterol (VENTOLIN HFA) 108 (90 Base) MCG/ACT inhaler, Inhale 1-2 puffs into the lungs every 4 (four) hours as needed for wheezing or shortness of breath., Disp: 18 g, Rfl: 0   dicyclomine (BENTYL) 20 MG tablet, Take 1 tablet (20 mg total) by mouth 2 (two) times daily., Disp: 20 tablet, Rfl: 0   loperamide (IMODIUM A-D) 2 MG tablet, Take 2 mg by mouth 4 (four) times daily as needed for diarrhea or loose stools., Disp: , Rfl:    loratadine (CLARITIN) 10 MG tablet, Take 10 mg by mouth daily as needed for allergies., Disp: , Rfl:    naproxen (NAPROSYN) 500 MG tablet, Take 1 tablet (500 mg total) by mouth 2 (two) times daily., Disp: 10 tablet, Rfl: 0   ondansetron (ZOFRAN ODT) 4 MG disintegrating tablet, Take 1 tablet (4 mg total) by mouth every 8 (eight) hours as needed for nausea., Disp: 10 tablet, Rfl: 0   Medications ordered in this encounter:  Meds ordered this encounter  Medications   albuterol (VENTOLIN HFA) 108 (90 Base) MCG/ACT inhaler    Sig: Inhale 1-2 puffs into the lungs every 4 (four) hours as needed for wheezing or shortness of breath.    Dispense:  18 g    Refill:  0    Order Specific Question:   Supervising Provider    Answer:   Hyacinth Meeker, BRIAN [[id number]]     *If you need refills on other medications prior to your next appointment, please contact your pharmacy*  Follow-Up: Call back or seek an in-person evaluation if the symptoms worsen or if the condition fails to improve as anticipated.  Other  Instructions Follow up with PCP for long-term refills of inhaler   If you have been instructed to have an in-person evaluation today at a local Urgent Care facility, please use the link below. It will take you to a list of all of our available [facility], including address, phone number and hours of operation. Please do not delay care.  [facility]  If you or a family member do not have a primary care provider, use the link below to schedule a visit and establish care. When you choose a [facility] primary care physician or advanced practice provider, you gain a long-term partner in health. Find a Primary Care Provider  Learn more about [facility]'s in-office and virtual care options: [facility] - Get Care Now

## 2022-02-13 ENCOUNTER — Other Ambulatory Visit: Payer: Self-pay | Admitting: Oncology

## 2022-02-13 DIAGNOSIS — J4520 Mild intermittent asthma, uncomplicated: Secondary | ICD-10-CM

## 2022-02-19 ENCOUNTER — Telehealth: Payer: 59 | Admitting: Nurse Practitioner

## 2022-02-19 DIAGNOSIS — J45901 Unspecified asthma with (acute) exacerbation: Secondary | ICD-10-CM | POA: Diagnosis not present

## 2022-02-19 MED ORDER — ALBUTEROL SULFATE HFA 108 (90 BASE) MCG/ACT IN AERS: 2.0000 | INHALATION_SPRAY | Freq: Four times a day (QID) | RESPIRATORY_TRACT | 1 refills | Status: DC | PRN

## 2022-02-19 NOTE — Progress Notes (Signed)
?Virtual Visit Consent  ? ?Nicholas Braun, you are scheduled for a virtual visit with a [facility] provider today.   ?  ?Just as with appointments in the office, your consent must be obtained to participate.  Your consent will be active for this visit and any virtual visit you may have with one of our providers in the next 365 days.   ?  ?If you have a MyChart account, a copy of this consent can be sent to you electronically.  All virtual visits are billed to your insurance company just like a traditional visit in the office.   ? ?As this is a virtual visit, video technology does not allow for your provider to perform a traditional examination.  This may limit your provider's ability to fully assess your condition.  If your provider identifies any concerns that need to be evaluated in person or the need to arrange testing (such as labs, EKG, etc.), we will make arrangements to do so.   ?  ?Although advances in technology are sophisticated, we cannot ensure that it will always work on either your end or our end.  If the connection with a video visit is poor, the visit may have to be switched to a telephone visit.  With either a video or telephone visit, we are not always able to ensure that we have a secure connection.    ? ?I need to obtain your verbal consent now.   Are you willing to proceed with your visit today?  ?  ?Nicholas Braun has provided verbal consent on 02/19/2022 for a virtual visit (video or telephone). ?  ?Abran Cantor, NP  ? ?Date: 02/19/2022 3:08 PM ? ? ?Virtual Visit via Video Note  ? ?Nicholas Braun, connected with  Mandy Fitzwater  ([id number], 1986/10/03) on 02/19/22 at  4:00 PM EDT by a video-enabled telemedicine application and verified that I am speaking with the correct person using two identifiers. ? ?Location: ?Patient: Virtual Visit Location Patient: Home ?Provider: Virtual Visit Location Provider: Home ?  ?I discussed the limitations of  evaluation and management by telemedicine and the availability of in person appointments. The patient expressed understanding and agreed to proceed.   ? ?History of Present Illness: ?Nicholas Braun is a 36 y.o. who identifies as a male who was assigned male at birth, and is being seen today for asthma refill. Reports he has been experiencing asthma symptoms since the weather has changed. He has no active symptoms currently, but does admit to intermittent SOB and wheezing. 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 He has not been able to follow-up with a PCP. States the last inhaler he was prescribed did not work as well for him. Denies any recent hospitalizations. ? ?HPI: HPI  ?Problems: There are no problems to display for this patient. ?  ?Allergies:  ?Allergies  ?Allergen Reactions  ? Food Hives, Swelling and Other (See Comments)  ?  Pt states that he is allergic to ranch dressing.    ? ?Medications:  ?Current Outpatient Medications:  ?  albuterol (VENTOLIN HFA) 108 (90 Base) MCG/ACT inhaler, Inhale 1-2 puffs into the lungs every 4 (four) hours as needed for wheezing or shortness of breath., Disp: 18 g, Rfl: 0 ?  dicyclomine (BENTYL) 20 MG tablet, Take 1 tablet (20 mg total) by mouth 2 (two) times daily., Disp: 20 tablet, Rfl: 0 ?  loperamide (IMODIUM A-D) 2 MG tablet, Take 2 mg  by mouth 4 (four) times daily as needed for diarrhea or loose stools., Disp: , Rfl:  ?  loratadine (CLARITIN) 10 MG tablet, Take 10 mg by mouth daily as needed for allergies., Disp: , Rfl:  ?  naproxen (NAPROSYN) 500 MG tablet, Take 1 tablet (500 mg total) by mouth 2 (two) times daily., Disp: 10 tablet, Rfl: 0 ?  ondansetron (ZOFRAN ODT) 4 MG disintegrating tablet, Take 1 tablet (4 mg total) by mouth every 8 (eight) hours as needed for nausea., Disp: 10 tablet, Rfl: 0 ? ?Observations/Objective: ?Patient is well-developed, well-nourished in no acute distress.  ?Resting comfortably at home.  ?Head is normocephalic,  atraumatic.  ?No labored breathing.  ?Speech is clear and coherent with logical content.  ?Patient is alert and oriented at baseline.  ? ? ?Assessment and Plan: ?1. Mild asthma with acute exacerbation, unspecified whether persistent ?- albuterol (VENTOLIN HFA) 108 (90 Base) MCG/ACT inhaler; Inhale 2 puffs into the lungs every 6 (six) hours as needed for wheezing or shortness of breath.  Dispense: 8 g; Refill: 1 ? ?Patient presents for refill of asthma inhaler. Has had intermittent symptoms such as SOB and wheezing with change of weather and painting related to his job. No red flag symptoms noted. Will provide refill for the patient at this time. Recommend he establish care with a PCP for continue maintenance of his asthma. Patient verbalizes understanding, all questions answered.  ? ? ?Follow Up Instructions: ?I discussed the assessment and treatment plan with the patient. The patient was provided an opportunity to ask questions and all were answered. The patient agreed with the plan and demonstrated an understanding of the instructions.  A copy of instructions were sent to the patient via MyChart unless otherwise noted below.  ? ?The patient was advised to call back or seek an in-person evaluation if the symptoms worsen or if the condition fails to improve as anticipated. ? ?Time:  ?I spent 10 minutes with the patient via telehealth technology discussing the above problems/concerns.   ? ?Abran Cantor, NP ?

## 2022-02-19 NOTE — Patient Instructions (Signed)
?  Nydia Bouton, thank you for joining Abran Cantor, NP for today's virtual visit.  While this provider is not your primary care provider (PCP), if your PCP is located in our provider database this encounter information will be shared with them immediately following your visit. ? ?Consent: ?(Patient) Nicholas Braun provided verbal consent for this virtual visit at the beginning of the encounter. ? ?Current Medications: ? ?Current Outpatient Medications:  ?  albuterol (VENTOLIN HFA) 108 (90 Base) MCG/ACT inhaler, Inhale 2 puffs into the lungs every 6 (six) hours as needed for wheezing or shortness of breath., Disp: 8 g, Rfl: 1 ?  albuterol (VENTOLIN HFA) 108 (90 Base) MCG/ACT inhaler, Inhale 1-2 puffs into the lungs every 4 (four) hours as needed for wheezing or shortness of breath., Disp: 18 g, Rfl: 0 ?  dicyclomine (BENTYL) 20 MG tablet, Take 1 tablet (20 mg total) by mouth 2 (two) times daily., Disp: 20 tablet, Rfl: 0 ?  loperamide (IMODIUM A-D) 2 MG tablet, Take 2 mg by mouth 4 (four) times daily as needed for diarrhea or loose stools., Disp: , Rfl:  ?  loratadine (CLARITIN) 10 MG tablet, Take 10 mg by mouth daily as needed for allergies., Disp: , Rfl:  ?  naproxen (NAPROSYN) 500 MG tablet, Take 1 tablet (500 mg total) by mouth 2 (two) times daily., Disp: 10 tablet, Rfl: 0 ?  ondansetron (ZOFRAN ODT) 4 MG disintegrating tablet, Take 1 tablet (4 mg total) by mouth every 8 (eight) hours as needed for nausea., Disp: 10 tablet, Rfl: 0  ? ?Medications ordered in this encounter:  ?Meds ordered this encounter  ?Medications  ? albuterol (VENTOLIN HFA) 108 (90 Base) MCG/ACT inhaler  ?  Sig: Inhale 2 puffs into the lungs every 6 (six) hours as needed for wheezing or shortness of breath.  ?  Dispense:  8 g  ?  Refill:  1  ?  ? ?*If you need refills on other medications prior to your next appointment, please contact your pharmacy* ? ?Follow-Up: ?Call back or seek an in-person evaluation if the  symptoms worsen or if the condition fails to improve as anticipated. ? ?Other Instructions ?Follow up with a PCP to establish care for continued monitoring and surveillance of asthma symptoms.  ? ? ?If you have been instructed to have an in-person evaluation today at a local Urgent Care facility, please use the link below. It will take you to a list of all of our available [facility], including address, phone number and hours of operation. Please do not delay care.  ?[facility] ? ?If you or a family member do not have a primary care provider, use the link below to schedule a visit and establish care. When you choose a [facility] primary care physician or advanced practice provider, you gain a long-term partner in health. ?Find a Primary Care Provider ? ?Learn more about [facility]'s in-office and virtual care options: ? - Get Care Now ? ?

## 2022-02-19 NOTE — Progress Notes (Signed)
Erroneous encounter. Patient scheduled 2 appointments. ?

## 2023-05-11 ENCOUNTER — Telehealth: Payer: 59 | Admitting: Family Medicine

## 2023-05-11 DIAGNOSIS — J45901 Unspecified asthma with (acute) exacerbation: Secondary | ICD-10-CM | POA: Diagnosis not present

## 2023-05-11 DIAGNOSIS — J4520 Mild intermittent asthma, uncomplicated: Secondary | ICD-10-CM

## 2023-05-11 MED ORDER — ALBUTEROL SULFATE HFA 108 (90 BASE) MCG/ACT IN AERS: 2.0000 | INHALATION_SPRAY | Freq: Four times a day (QID) | RESPIRATORY_TRACT | 1 refills | Status: DC | PRN

## 2023-05-11 NOTE — Progress Notes (Signed)
Virtual Visit Consent   Nicholas Braun, you are scheduled for a virtual visit with a [facility] provider today. Just as with appointments in the office, your consent must be obtained to participate. Your consent will be active for this visit and any virtual visit you may have with one of our providers in the next 365 days. If you have a MyChart account, a copy of this consent can be sent to you electronically.  As this is a virtual visit, video technology does not allow for your provider to perform a traditional examination. This may limit your provider's ability to fully assess your condition. If your provider identifies any concerns that need to be evaluated in person or the need to arrange testing (such as labs, EKG, etc.), we will make arrangements to do so. Although advances in technology are sophisticated, we cannot ensure that it will always work on either your end or our end. If the connection with a video visit is poor, the visit may have to be switched to a telephone visit. With either a video or telephone visit, we are not always able to ensure that we have a secure connection.  By engaging in this virtual visit, you consent to the provision of healthcare and authorize for your insurance to be billed (if applicable) for the services provided during this visit. Depending on your insurance coverage, you may receive a charge related to this service.  I need to obtain your verbal consent now. Are you willing to proceed with your visit today? Nicholas Braun has provided verbal consent on 05/11/2023 for a virtual visit (video or telephone). Georgana Curio, FNP  Date: 05/11/2023 5:46 PM  Virtual Visit via Video Note   I, Georgana Curio, connected with  Nicholas Braun  ([id number], [birth year]) on 05/11/23 at  7:00 PM EDT by a video-enabled telemedicine application and verified that I am speaking with the correct person using two identifiers.  Location: Patient: Virtual Visit  Location Patient: Home Provider: Virtual Visit Location Provider: Home Office   I discussed the limitations of evaluation and management by telemedicine and the availability of in person appointments. The patient expressed understanding and agreed to proceed.    History of Present Illness: Nicholas Braun is a 37 y.o. who identifies as a male who was assigned male at birth, and is being seen today for wheezing with asthma. He denies being in any distress. He requests a refill. No fever. Marland Kitchen  HPI: HPI  Problems: There are no problems to display for this patient.   Allergies:  Allergies  Allergen Reactions   Food Hives, Swelling and Other (See Comments)    Pt states that he is allergic to ranch dressing.     Medications:  Current Outpatient Medications:    albuterol (VENTOLIN HFA) 108 (90 Base) MCG/ACT inhaler, Inhale 2 puffs into the lungs every 6 (six) hours as needed for wheezing or shortness of breath., Disp: 1 each, Rfl: 1   dicyclomine (BENTYL) 20 MG tablet, Take 1 tablet (20 mg total) by mouth 2 (two) times daily., Disp: 20 tablet, Rfl: 0   loperamide (IMODIUM A-D) 2 MG tablet, Take 2 mg by mouth 4 (four) times daily as needed for diarrhea or loose stools., Disp: , Rfl:    loratadine (CLARITIN) 10 MG tablet, Take 10 mg by mouth daily as needed for allergies., Disp: , Rfl:    naproxen (NAPROSYN) 500 MG tablet, Take 1 tablet (500 mg total) by mouth 2 (two) times daily.,  Disp: 10 tablet, Rfl: 0   ondansetron (ZOFRAN ODT) 4 MG disintegrating tablet, Take 1 tablet (4 mg total) by mouth every 8 (eight) hours as needed for nausea., Disp: 10 tablet, Rfl: 0  Observations/Objective: Patient is well-developed, well-nourished in no acute distress.  Resting comfortably  at home.  Head is normocephalic, atraumatic.  No labored breathing.  Speech is clear and coherent with logical content.  Patient is alert and oriented at baseline.    Assessment and Plan: 1. Mild intermittent asthma  without complication  2. Mild asthma with acute exacerbation, unspecified whether persistent - albuterol (VENTOLIN HFA) 108 (90 Base) MCG/ACT inhaler; Inhale 2 puffs into the lungs every 6 (six) hours as needed for wheezing or shortness of breath.  Dispense: 1 each; Refill: 1  Follow up with pcp.   Follow Up Instructions: I discussed the assessment and treatment plan with the patient. The patient was provided an opportunity to ask questions and all were answered. The patient agreed with the plan and demonstrated an understanding of the instructions.  A copy of instructions were sent to the patient via MyChart unless otherwise noted below.     The patient was advised to call back or seek an in-person evaluation if the symptoms worsen or if the condition fails to improve as anticipated.  Time:  I spent 10 minutes with the patient via telehealth technology discussing the above problems/concerns.    Georgana Curio, FNP

## 2023-05-11 NOTE — Patient Instructions (Signed)
Asthma, Adult  Asthma is a long-term (chronic) condition that causes recurrent episodes in which the lower airways in the lungs become tight and narrow. The narrowing is caused by inflammation and tightening of the smooth muscle around the lower airways. Asthma episodes, also called asthma attacks or asthma flares, may cause coughing, making high-pitched whistling sounds when you breathe, most often when you breathe out (wheezing), shortness of breath, and chest pain. The airways may produce extra mucus caused by the inflammation and irritation. During an attack, it can be difficult to breathe. Asthma attacks can range from minor to life-threatening. Asthma cannot be cured, but medicines and lifestyle changes can help control it and treat acute attacks. It is important to keep your asthma well controlled so the condition does not interfere with your daily life. What are the causes? This condition is believed to be caused by inherited (genetic) and environmental factors, but its exact cause is not known. What can trigger an asthma attack? Many things can bring on an asthma attack or make symptoms worse. These triggers are different for every person. Common triggers include: Allergens and irritants like mold, dust, pet dander, cockroaches, pollen, air pollution, and chemical odors. Cigarette smoke. Weather changes and cold air. Stress and strong emotional responses such as crying or laughing hard. Certain medications such as aspirin or beta blockers. Infections and inflammatory conditions, such as the flu, a cold, pneumonia, or inflammation of the nasal membranes (rhinitis). Gastroesophageal reflux disease (GERD). What are the signs or symptoms? Symptoms may occur right after exposure to an asthma trigger or hours later and can vary by person. Common signs and symptoms include: Wheezing. Trouble breathing (shortness of breath). Excessive nighttime or early morning coughing. Chest  tightness. Tiredness (fatigue) with minimal activity. Difficulty talking in complete sentences. Poor exercise tolerance. How is this diagnosed? This condition is diagnosed based on: A physical exam and your medical history. Tests, which may include: Lung function studies to evaluate the flow of air in your lungs. Allergy tests. Imaging tests, such as X-rays. How is this treated? There is no cure, but symptoms can be controlled with proper treatment. Treatment usually involves: Identifying and avoiding your asthma triggers. Inhaled medicines. Two types are commonly used to treat asthma, depending on severity: Controller medicines. These help prevent asthma symptoms from occurring. They are taken every day. Fast-acting reliever or rescue medicines. These quickly relieve asthma symptoms. They are used as needed and provide short-term relief. Using other medicines, such as: Allergy medicines, such as antihistamines, if your asthma attacks are triggered by allergens. Immune medicines (immunomodulators). These are medicines that help control the immune system. Using supplemental oxygen. This is only needed during a severe episode. Creating an asthma action plan. An asthma action plan is a written plan for managing and treating your asthma attacks. This plan includes: A list of your asthma triggers and how to avoid them. Information about when medicines should be taken and when their dosage should be changed. Instructions about using a device called a peak flow meter. A peak flow meter measures how well the lungs are working and the severity of your asthma. It helps you monitor your condition. Follow these instructions at home: Take over-the-counter and prescription medicines only as told by your health care provider. Stay up to date on all vaccinations as recommended by your healthcare provider, including vaccines for the flu and pneumonia. Use a peak flow meter and keep track of your peak flow  readings. Understand and use your asthma   action plan to address any asthma flares. Do not smoke or allow anyone to smoke in your home. Contact a health care provider if: You have wheezing, shortness of breath, or a cough that is not responding to medicines. Your medicines are causing side effects, such as a rash, itching, swelling, or trouble breathing. You need to use a reliever medicine more than 2-3 times a week. Your peak flow reading is still at 50-79% of your personal best after following your action plan for 1 hour. You have a fever and shortness of breath. Get help right away if: You are getting worse and do not respond to treatment during an asthma attack. You are short of breath when at rest or when doing very little physical activity. You have difficulty eating, drinking, or talking. You have chest pain or tightness. You develop a fast heartbeat or palpitations. You have a bluish color to your lips or fingernails. You are light-headed or dizzy, or you faint. Your peak flow reading is less than 50% of your personal best. You feel too tired to breathe normally. These symptoms may be an emergency. Get help right away. Call 911. Do not wait to see if the symptoms will go away. Do not drive yourself to the hospital. Summary Asthma is a long-term (chronic) condition that causes recurrent episodes in which the airways become tight and narrow. Asthma episodes, also called asthma attacks or asthma flares, can cause coughing, wheezing, shortness of breath, and chest pain. Asthma cannot be cured, but medicines and lifestyle changes can help keep it well controlled and prevent asthma flares. Make sure you understand how to avoid triggers and how and when to use your medicines. Asthma attacks can range from minor to life-threatening. Get help right away if you have an asthma attack and do not respond to treatment with your usual rescue medicines. This information is not intended to replace  advice given to you by your health care provider. Make sure you discuss any questions you have with your health care provider. Document Revised: 08/31/2021 Document Reviewed: 08/22/2021 Elsevier Patient Education  2024 Elsevier Inc.  

## 2023-07-09 ENCOUNTER — Telehealth: Payer: 59 | Admitting: Physician Assistant

## 2023-07-09 DIAGNOSIS — J45901 Unspecified asthma with (acute) exacerbation: Secondary | ICD-10-CM

## 2023-07-09 MED ORDER — ALBUTEROL SULFATE HFA 108 (90 BASE) MCG/ACT IN AERS: 1.0000 | INHALATION_SPRAY | Freq: Four times a day (QID) | RESPIRATORY_TRACT | 1 refills | Status: DC | PRN

## 2023-07-09 NOTE — Patient Instructions (Signed)
Nydia Bouton, thank you for joining Margaretann Loveless, PA-C for today's virtual visit.  While this provider is not your primary care provider (PCP), if your PCP is located in our provider database this encounter information will be shared with them immediately following your visit.   A [facility] MyChart account gives you access to today's visit and all your visits, tests, and labs performed at [facility] " click here if you don't have a [facility] MyChart account or go to [email or web address]  Consent: (Patient) Nicholas Braun provided verbal consent for this virtual visit at the beginning of the encounter.  Current Medications:  Current Outpatient Medications:    albuterol (VENTOLIN HFA) 108 (90 Base) MCG/ACT inhaler, Inhale 1-2 puffs into the lungs every 6 (six) hours as needed for wheezing or shortness of breath., Disp: 18 g, Rfl: 1   dicyclomine (BENTYL) 20 MG tablet, Take 1 tablet (20 mg total) by mouth 2 (two) times daily., Disp: 20 tablet, Rfl: 0   loperamide (IMODIUM A-D) 2 MG tablet, Take 2 mg by mouth 4 (four) times daily as needed for diarrhea or loose stools., Disp: , Rfl:    loratadine (CLARITIN) 10 MG tablet, Take 10 mg by mouth daily as needed for allergies., Disp: , Rfl:    naproxen (NAPROSYN) 500 MG tablet, Take 1 tablet (500 mg total) by mouth 2 (two) times daily., Disp: 10 tablet, Rfl: 0   ondansetron (ZOFRAN ODT) 4 MG disintegrating tablet, Take 1 tablet (4 mg total) by mouth every 8 (eight) hours as needed for nausea., Disp: 10 tablet, Rfl: 0   Medications ordered in this encounter:  Meds ordered this encounter  Medications   albuterol (VENTOLIN HFA) 108 (90 Base) MCG/ACT inhaler    Sig: Inhale 1-2 puffs into the lungs every 6 (six) hours as needed for wheezing or shortness of breath.    Dispense:  18 g    Refill:  1    Order Specific Question:   Supervising Provider    Answer:   Merrilee Jansky [id number]     *If you need  refills on other medications prior to your next appointment, please contact your pharmacy*  Follow-Up: Call back or seek an in-person evaluation if the symptoms worsen or if the condition fails to improve as anticipated.   Virtual Care [phone]  Other Instructions  Asthma, Adult  Asthma is a long-term (chronic) condition that causes recurrent episodes in which the lower airways in the lungs become tight and narrow. The narrowing is caused by inflammation and tightening of the smooth muscle around the lower airways. Asthma episodes, also called asthma attacks or asthma flares, may cause coughing, making high-pitched whistling sounds when you breathe, most often when you breathe out (wheezing), shortness of breath, and chest pain. The airways may produce extra mucus caused by the inflammation and irritation. During an attack, it can be difficult to breathe. Asthma attacks can range from minor to life-threatening. Asthma cannot be cured, but medicines and lifestyle changes can help control it and treat acute attacks. It is important to keep your asthma well controlled so the condition does not interfere with your daily life. What are the causes? This condition is believed to be caused by inherited (genetic) and environmental factors, but its exact cause is not known. What can trigger an asthma attack? Many things can bring on an asthma attack or make symptoms worse. These triggers are different for every person. Common triggers include: Allergens and irritants  like mold, dust, pet dander, cockroaches, pollen, air pollution, and chemical odors. Cigarette smoke. Weather changes and cold air. Stress and strong emotional responses such as crying or laughing hard. Certain medications such as aspirin or beta blockers. Infections and inflammatory conditions, such as the flu, a cold, pneumonia, or inflammation of the nasal membranes (rhinitis). Gastroesophageal reflux disease  (GERD). What are the signs or symptoms? Symptoms may occur right after exposure to an asthma trigger or hours later and can vary by person. Common signs and symptoms include: Wheezing. Trouble breathing (shortness of breath). Excessive nighttime or early morning coughing. Chest tightness. Tiredness (fatigue) with minimal activity. Difficulty talking in complete sentences. Poor exercise tolerance. How is this diagnosed? This condition is diagnosed based on: A physical exam and your medical history. Tests, which may include: Lung function studies to evaluate the flow of air in your lungs. Allergy tests. Imaging tests, such as X-rays. How is this treated? There is no cure, but symptoms can be controlled with proper treatment. Treatment usually involves: Identifying and avoiding your asthma triggers. Inhaled medicines. Two types are commonly used to treat asthma, depending on severity: Controller medicines. These help prevent asthma symptoms from occurring. They are taken every day. Fast-acting reliever or rescue medicines. These quickly relieve asthma symptoms. They are used as needed and provide short-term relief. Using other medicines, such as: Allergy medicines, such as antihistamines, if your asthma attacks are triggered by allergens. Immune medicines (immunomodulators). These are medicines that help control the immune system. Using supplemental oxygen. This is only needed during a severe episode. Creating an asthma action plan. An asthma action plan is a written plan for managing and treating your asthma attacks. This plan includes: A list of your asthma triggers and how to avoid them. Information about when medicines should be taken and when their dosage should be changed. Instructions about using a device called a peak flow meter. A peak flow meter measures how well the lungs are working and the severity of your asthma. It helps you monitor your condition. Follow these instructions  at home: Take over-the-counter and prescription medicines only as told by your health care provider. Stay up to date on all vaccinations as recommended by your healthcare provider, including vaccines for the flu and pneumonia. Use a peak flow meter and keep track of your peak flow readings. Understand and use your asthma action plan to address any asthma flares. Do not smoke or allow anyone to smoke in your home. Contact a health care provider if: You have wheezing, shortness of breath, or a cough that is not responding to medicines. Your medicines are causing side effects, such as a rash, itching, swelling, or trouble breathing. You need to use a reliever medicine more than 2-3 times a week. Your peak flow reading is still at 50-79% of your personal best after following your action plan for 1 hour. You have a fever and shortness of breath. Get help right away if: You are getting worse and do not respond to treatment during an asthma attack. You are short of breath when at rest or when doing very little physical activity. You have difficulty eating, drinking, or talking. You have chest pain or tightness. You develop a fast heartbeat or palpitations. You have a bluish color to your lips or fingernails. You are light-headed or dizzy, or you faint. Your peak flow reading is less than 50% of your personal best. You feel too tired to breathe normally. These symptoms may be an emergency. Get  help right away. Call 911. Do not wait to see if the symptoms will go away. Do not drive yourself to the hospital. Summary Asthma is a long-term (chronic) condition that causes recurrent episodes in which the airways become tight and narrow. Asthma episodes, also called asthma attacks or asthma flares, can cause coughing, wheezing, shortness of breath, and chest pain. Asthma cannot be cured, but medicines and lifestyle changes can help keep it well controlled and prevent asthma flares. Make sure you  understand how to avoid triggers and how and when to use your medicines. Asthma attacks can range from minor to life-threatening. Get help right away if you have an asthma attack and do not respond to treatment with your usual rescue medicines. This information is not intended to replace advice given to you by your health care provider. Make sure you discuss any questions you have with your health care provider. Document Revised: 08/31/2021 Document Reviewed: 08/22/2021 Elsevier Patient Education  2024 Elsevier Inc.    If you have been instructed to have an in-person evaluation today at a local Urgent Care facility, please use the link below. It will take you to a list of all of our available [facility], including address, phone number and hours of operation. Please do not delay care.  [facility]  If you or a family member do not have a primary care provider, use the link below to schedule a visit and establish care. When you choose a [facility] primary care physician or advanced practice provider, you gain a long-term partner in health. Find a Primary Care Provider  Learn more about [facility]'s in-office and virtual care options: [facility] - Get Care Now

## 2023-07-09 NOTE — Progress Notes (Signed)
Virtual Visit Consent   Javarous Matulewicz, you are scheduled for a virtual visit with a [facility] provider today. Just as with appointments in the office, your consent must be obtained to participate. Your consent will be active for this visit and any virtual visit you may have with one of our providers in the next 365 days. If you have a MyChart account, a copy of this consent can be sent to you electronically.  As this is a virtual visit, video technology does not allow for your provider to perform a traditional examination. This may limit your provider's ability to fully assess your condition. If your provider identifies any concerns that need to be evaluated in person or the need to arrange testing (such as labs, EKG, etc.), we will make arrangements to do so. Although advances in technology are sophisticated, we cannot ensure that it will always work on either your end or our end. If the connection with a video visit is poor, the visit may have to be switched to a telephone visit. With either a video or telephone visit, we are not always able to ensure that we have a secure connection.  By engaging in this virtual visit, you consent to the provision of healthcare and authorize for your insurance to be billed (if applicable) for the services provided during this visit. Depending on your insurance coverage, you may receive a charge related to this service.  I need to obtain your verbal consent now. Are you willing to proceed with your visit today? Nicholas Braun has provided verbal consent on 07/09/2023 for a virtual visit (video or telephone). Margaretann Loveless, PA-C  Date: 07/09/2023 6:06 PM  Virtual Visit via Video Note   I, Margaretann Loveless, connected with  Nicholas Braun  ([id number], 08/10/1986) on 07/09/23 at  6:00 PM EDT by a video-enabled telemedicine application and verified that I am speaking with the correct person using two identifiers.  Location: Patient:  Virtual Visit Location Patient: Mobile Provider: Virtual Visit Location Provider: Home Office   I discussed the limitations of evaluation and management by telemedicine and the availability of in person appointments. The patient expressed understanding and agreed to proceed.    History of Present Illness: Nicholas Braun is a 37 y.o. who identifies as a male who was assigned male at birth, and is being seen today for medication refill. Requesting refill of rescue inhaler for intermittent asthma. No acute symptoms of wheezing or shortness of breath. 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   Problems: There are no problems to display for this patient.   Allergies:  Allergies  Allergen Reactions   Food Hives, Swelling and Other (See Comments)    Pt states that he is allergic to ranch dressing.     Medications:  Current Outpatient Medications:    albuterol (VENTOLIN HFA) 108 (90 Base) MCG/ACT inhaler, Inhale 1-2 puffs into the lungs every 6 (six) hours as needed for wheezing or shortness of breath., Disp: 18 g, Rfl: 1   dicyclomine (BENTYL) 20 MG tablet, Take 1 tablet (20 mg total) by mouth 2 (two) times daily., Disp: 20 tablet, Rfl: 0   loperamide (IMODIUM A-D) 2 MG tablet, Take 2 mg by mouth 4 (four) times daily as needed for diarrhea or loose stools., Disp: , Rfl:    loratadine (CLARITIN) 10 MG tablet, Take 10 mg by mouth daily as needed for allergies., Disp: , Rfl:  naproxen (NAPROSYN) 500 MG tablet, Take 1 tablet (500 mg total) by mouth 2 (two) times daily., Disp: 10 tablet, Rfl: 0   ondansetron (ZOFRAN ODT) 4 MG disintegrating tablet, Take 1 tablet (4 mg total) by mouth every 8 (eight) hours as needed for nausea., Disp: 10 tablet, Rfl: 0  Observations/Objective: Patient is well-developed, well-nourished in no acute distress.  Resting comfortably Head is normocephalic, atraumatic.  No labored breathing. Speech is clear and coherent with  logical content.  Patient is alert and oriented at baseline.    Assessment and Plan: 1. Mild asthma with acute exacerbation, unspecified whether persistent - albuterol (VENTOLIN HFA) 108 (90 Base) MCG/ACT inhaler; Inhale 1-2 puffs into the lungs every 6 (six) hours as needed for wheezing or shortness of breath.  Dispense: 18 g; Refill: 1  - Albuterol Refilled with 1 refill to get to 90 days (last refilled by the Virtual team on 05/11/23 with one inhaler with 1 refill) - Advised patient he will need an in person evaluation or, hopefully, be established with a new PCP for any further refills; he voiced understanding  Follow Up Instructions: I discussed the assessment and treatment plan with the patient. The patient was provided an opportunity to ask questions and all were answered. The patient agreed with the plan and demonstrated an understanding of the instructions.  A copy of instructions were sent to the patient via MyChart unless otherwise noted below.    The patient was advised to call back or seek an in-person evaluation if the symptoms worsen or if the condition fails to improve as anticipated.  Time:  I spent 8 minutes with the patient via telehealth technology discussing the above problems/concerns.    Margaretann Loveless, PA-C

## 2023-12-23 ENCOUNTER — Emergency Department (HOSPITAL_COMMUNITY)
Admission: EM | Admit: 2023-12-23 | Discharge: 2023-12-23 | Disposition: A | Discharge status: Home / Self Care | Payer: Self-pay | Attending: Emergency Medicine | Admitting: Emergency Medicine

## 2023-12-23 ENCOUNTER — Telehealth: Payer: Self-pay | Admitting: Family

## 2023-12-23 ENCOUNTER — Emergency Department (HOSPITAL_COMMUNITY): Payer: Self-pay

## 2023-12-23 DIAGNOSIS — J45901 Unspecified asthma with (acute) exacerbation: Secondary | ICD-10-CM | POA: Insufficient documentation

## 2023-12-23 DIAGNOSIS — Z760 Encounter for issue of repeat prescription: Secondary | ICD-10-CM | POA: Insufficient documentation

## 2023-12-23 MED ORDER — DEXAMETHASONE 4 MG PO TABS
10.0000 mg | ORAL_TABLET | Freq: Once | ORAL | Status: AC
  Administered 2023-12-23: 10 mg via ORAL
  Filled 2023-12-23: qty 1

## 2023-12-23 MED ORDER — ALBUTEROL SULFATE HFA 108 (90 BASE) MCG/ACT IN AERS: 1.0000 | INHALATION_SPRAY | Freq: Four times a day (QID) | RESPIRATORY_TRACT | 1 refills | Status: DC | PRN

## 2023-12-23 MED ORDER — ALBUTEROL SULFATE HFA 108 (90 BASE) MCG/ACT IN AERS
2.0000 | INHALATION_SPRAY | RESPIRATORY_TRACT | Status: DC | PRN
  Administered 2023-12-23: 2 via RESPIRATORY_TRACT
  Filled 2023-12-23: qty 6.7

## 2023-12-23 NOTE — Progress Notes (Signed)
Virtual Visit Consent   [name], you are scheduled for a virtual visit with a [facility] provider today. Just as with appointments in the office, your consent must be obtained to participate. Your consent will be active for this visit and any virtual visit you may have with one of our providers in the next 365 days. If you have a MyChart account, a copy of this consent can be sent to you electronically.  As this is a virtual visit, video technology does not allow for your provider to perform a traditional examination. This may limit your provider's ability to fully assess your condition. If your provider identifies any concerns that need to be evaluated in person or the need to arrange testing (such as labs, EKG, etc.), we will make arrangements to do so. Although advances in technology are sophisticated, we cannot ensure that it will always work on either your end or our end. If the connection with a video visit is poor, the visit may have to be switched to a telephone visit. With either a video or telephone visit, we are not always able to ensure that we have a secure connection.  By engaging in this virtual visit, you consent to the provision of healthcare and authorize for your insurance to be billed (if applicable) for the services provided during this visit. Depending on your insurance coverage, you may receive a charge related to this service.  I need to obtain your verbal consent now. Are you willing to proceed with your visit today? Nicholas Braun has provided verbal consent on 12/23/2023 for a virtual visit (video or telephone). Jannifer Rodney, FNP  Date: 12/23/2023 4:52 PM  Virtual Visit via Video Note   I, Jannifer Rodney, connected with  Nicholas Braun  ([id number], 23-Jan-1986) on 12/23/23 at  6:30 PM EST by a video-enabled telemedicine application and verified that I am speaking with the correct person using two identifiers.  Location: Patient: Virtual Visit  Location Patient: Other: car Provider: Virtual Visit Location Provider: Home Office   I discussed the limitations of evaluation and management by telemedicine and the availability of in person appointments. The patient expressed understanding and agreed to proceed.    History of Present Illness: Nicholas Braun is a 38 y.o. who identifies as a male who was assigned male at birth, and is being seen today for albuterol inhaler refill.  HPI: Asthma He complains of cough, frequent throat clearing, shortness of breath (some times) and wheezing. This is a chronic problem. The current episode started more than 1 year ago. The problem has been waxing and waning. The cough is non-productive. Pertinent negatives include no myalgias or nasal congestion. He reports moderate improvement on treatment. His past medical history is significant for asthma.    Problems: There are no active problems to display for this patient.   Allergies:  Allergies  Allergen Reactions   Food Hives, Swelling and Other (See Comments)    Pt states that he is allergic to ranch dressing.     Medications:  Current Outpatient Medications:    albuterol (VENTOLIN HFA) 108 (90 Base) MCG/ACT inhaler, Inhale 1-2 puffs into the lungs every 6 (six) hours as needed for wheezing or shortness of breath., Disp: 18 g, Rfl: 1   dicyclomine (BENTYL) 20 MG tablet, Take 1 tablet (20 mg total) by mouth 2 (two) times daily., Disp: 20 tablet, Rfl: 0   loperamide (IMODIUM A-D) 2 MG tablet, Take 2 mg by mouth 4 (four) times  daily as needed for diarrhea or loose stools., Disp: , Rfl:    loratadine (CLARITIN) 10 MG tablet, Take 10 mg by mouth daily as needed for allergies., Disp: , Rfl:    naproxen (NAPROSYN) 500 MG tablet, Take 1 tablet (500 mg total) by mouth 2 (two) times daily., Disp: 10 tablet, Rfl: 0   ondansetron (ZOFRAN ODT) 4 MG disintegrating tablet, Take 1 tablet (4 mg total) by mouth every 8 (eight) hours as needed for nausea., Disp:  10 tablet, Rfl: 0  Observations/Objective: Patient is well-developed, well-nourished in no acute distress.  Resting comfortably   Head is normocephalic, atraumatic.  No labored breathing.  Speech is clear and coherent with logical content.  Patient is alert and oriented at baseline.    Assessment and Plan: 1. Mild asthma with acute exacerbation, unspecified whether persistent - albuterol (VENTOLIN HFA) 108 (90 Base) MCG/ACT inhaler; Inhale 1-2 puffs into the lungs every 6 (six) hours as needed for wheezing or shortness of breath.  Dispense: 18 g; Refill: 1  Will refill albuterol inhaler today Avoid triggers Rest Follow up if symptoms worsen or do not improve   Follow Up Instructions: I discussed the assessment and treatment plan with the patient. The patient was provided an opportunity to ask questions and all were answered. The patient agreed with the plan and demonstrated an understanding of the instructions.  A copy of instructions were sent to the patient via MyChart unless otherwise noted below.    The patient was advised to call back or seek an in-person evaluation if the symptoms worsen or if the condition fails to improve as anticipated.    Jannifer Rodney, FNP

## 2023-12-23 NOTE — ED Provider Notes (Signed)
Nicholas Braun Provider Note   CSN: [id number] Arrival date & time: 12/23/23  1922     History  Chief Complaint  Patient presents with   Asthma    Nicholas Braun is a 38 y.o. male.   Asthma  38 year old male history of asthma presenting for inhaler refill.  He states today around noon he developed some shortness of breath and wheezing.  Similar to prior asthma.  No fevers or chills or cough.  No chest pain.  He tried to pick up prescription after virtual appointment for his inhaler which she ran out of but could not get it filled so he comes in for evaluation.  He already received 2 puffs albuterol prior to my evaluation he feels great.  No shortness of breath, chest pain, or wheezing currently.  He would like to go home.     Home Medications Prior to Admission medications   Medication Sig Start Date End Date Taking? Authorizing Provider  albuterol (VENTOLIN HFA) 108 (90 Base) MCG/ACT inhaler Inhale 1-2 puffs into the lungs every 6 (six) hours as needed for wheezing or shortness of breath. 12/23/23   Jannifer Rodney A, FNP  dicyclomine (BENTYL) 20 MG tablet Take 1 tablet (20 mg total) by mouth 2 (two) times daily. 08/11/18   Garlon Hatchet, PA-C  loperamide (IMODIUM A-D) 2 MG tablet Take 2 mg by mouth 4 (four) times daily as needed for diarrhea or loose stools.    [provider]  loratadine (CLARITIN) 10 MG tablet Take 10 mg by mouth daily as needed for allergies.    [provider]  naproxen (NAPROSYN) 500 MG tablet Take 1 tablet (500 mg total) by mouth 2 (two) times daily. 07/21/19   Petrucelli, Samantha R, PA-C  ondansetron (ZOFRAN ODT) 4 MG disintegrating tablet Take 1 tablet (4 mg total) by mouth every 8 (eight) hours as needed for nausea. 08/11/18   Garlon Hatchet, PA-C      Allergies    Food    Review of Systems   Review of Systems Review of systems completed and notable as per HPI.  ROS otherwise  negative.   Physical Exam Updated Vital Signs BP 119/78 (BP Location: Right Arm)   Pulse 60   Temp 97.8 F (36.6 C) (Oral)   Resp 12   SpO2 98%  Physical Exam Vitals and nursing note reviewed.  Constitutional:      General: He is not in acute distress.    Appearance: He is well-developed.  HENT:     Head: Normocephalic and atraumatic.  Eyes:     Conjunctiva/sclera: Conjunctivae normal.  Cardiovascular:     Rate and Rhythm: Normal rate and regular rhythm.     Pulses: Normal pulses.     Heart sounds: Normal heart sounds. No murmur heard. Pulmonary:     Effort: Pulmonary effort is normal. No respiratory distress.     Breath sounds: Normal breath sounds. No wheezing.  Abdominal:     Palpations: Abdomen is soft.     Tenderness: There is no abdominal tenderness.  Musculoskeletal:        General: No swelling.     Cervical back: Neck supple.     Right lower leg: No edema.     Left lower leg: No edema.  Skin:    General: Skin is warm and dry.     Capillary Refill: Capillary refill takes less than 2 seconds.  Neurological:     Mental  Status: He is alert.  Psychiatric:        Mood and Affect: Mood normal.     ED Results / Procedures / Treatments   Labs (all labs ordered are listed, but only abnormal results are displayed) Labs Reviewed - No data to display  EKG EKG Interpretation Date/Time:  Sunday December 23 2023 19:32:12 EST Ventricular Rate:  56 PR Interval:  176 QRS Duration:  90 QT Interval:  402 QTC Calculation: 388 R Axis:   59  Text Interpretation: Sinus rhythm Abnormal R-wave progression, early transition Abnrm T, consider ischemia, anterolateral lds No significant change since last tracing Confirmed by Fulton Reek [phone]) on 12/23/2023 8:32:10 PM  Radiology DG Chest 2 View Result Date: 12/23/2023 CLINICAL DATA:  Shortness of breath today.  Ran out of inhaler. EXAM: CHEST - 2 VIEW COMPARISON:  Chest two views 03/19/2017 FINDINGS: Cardiac silhouette and  mediastinal contours are within normal limits. The lungs are clear. No pleural effusion or pneumothorax. No acute skeletal abnormality. IMPRESSION: No active cardiopulmonary disease. Electronically Signed   By: Neita Garnet M.D.   On: 12/23/2023 20:08    Procedures Procedures    Medications Ordered in ED Medications  albuterol (VENTOLIN HFA) 108 (90 Base) MCG/ACT inhaler 2 puff (2 puffs Inhalation Given 12/23/23 1931)  dexamethasone (DECADRON) tablet 10 mg (10 mg Oral Given 12/23/23 2051)    ED Course/ Medical Decision Making/ A&P                                 Medical Decision Making Amount and/or Complexity of Data Reviewed Radiology: ordered.  Risk Prescription drug management.   Medical Decision Making:   Nicholas Braun is a 38 y.o. male who presented to the ED today with asthma.  Vital signs reviewed.  He already received 2 puffs of albuterol which resolved his wheezing and shortness of breath.  His lungs are clear, chest x-ray without pneumonia or other acute abnormality.  He has no chest pain, EKG nonischemic low concern for ACS.  No signs or symptoms suggestive of PE.  I gave him a dose of Decadron for asthma exacerbation recommend he follow-up with his PCP.  He already has a inhaler to take home with him.  Return precautions given.  Patient's presentation is most consistent with exacerbation of chronic illness.           Final Clinical Impression(s) / ED Diagnoses Final diagnoses:  Mild asthma with exacerbation, unspecified whether persistent    Rx / DC Orders ED Discharge Orders     None         Laurence Spates, MD 12/23/23 [phone]

## 2023-12-23 NOTE — ED Triage Notes (Signed)
Patient arrived with complaints of shortness of breath since noon today, states he ran out of his inhaler and tried to do a virtual visit. Prescription sent to [facility] on [address] but was closed before he could get there. States this feels like his normal asthma.

## 2023-12-23 NOTE — Discharge Instructions (Signed)
Follow-up with your primary care doctor.  If you develop chest pain, difficulty breathing not responding to albuterol you should return to the ED.

## 2024-03-19 ENCOUNTER — Other Ambulatory Visit: Payer: Self-pay | Admitting: Family

## 2024-03-19 DIAGNOSIS — J45901 Unspecified asthma with (acute) exacerbation: Secondary | ICD-10-CM

## 2024-03-22 ENCOUNTER — Telehealth: Payer: Self-pay | Admitting: Family Medicine

## 2024-03-22 DIAGNOSIS — J45909 Unspecified asthma, uncomplicated: Secondary | ICD-10-CM

## 2024-03-22 MED ORDER — ALBUTEROL SULFATE HFA 108 (90 BASE) MCG/ACT IN AERS: 1.0000 | INHALATION_SPRAY | Freq: Four times a day (QID) | RESPIRATORY_TRACT | 1 refills | Status: DC | PRN

## 2024-03-22 NOTE — Patient Instructions (Signed)
 Curlie Doughty, thank you for joining [name], PA-C for today's virtual visit.  While this provider is not your primary care provider (PCP), if your PCP is located in our provider database this encounter information will be shared with them immediately following your visit.   A [facility] MyChart account gives you access to today's visit and all your visits, tests, and labs performed at [facility] " click here if you don't have a [facility] MyChart account or go to [email or web address]  Consent: (Patient) Nicholas Braun provided verbal consent for this virtual visit at the beginning of the encounter.  Current Medications:  Current Outpatient Medications:    albuterol  (VENTOLIN  HFA) 108 (90 Base) MCG/ACT inhaler, Inhale 1-2 puffs into the lungs every 6 (six) hours as needed for wheezing or shortness of breath., Disp: 18 g, Rfl: 1   dicyclomine  (BENTYL ) 20 MG tablet, Take 1 tablet (20 mg total) by mouth 2 (two) times daily., Disp: 20 tablet, Rfl: 0   loperamide (IMODIUM A-D) 2 MG tablet, Take 2 mg by mouth 4 (four) times daily as needed for diarrhea or loose stools., Disp: , Rfl:    loratadine  (CLARITIN ) 10 MG tablet, Take 10 mg by mouth daily as needed for allergies., Disp: , Rfl:    naproxen  (NAPROSYN ) 500 MG tablet, Take 1 tablet (500 mg total) by mouth 2 (two) times daily., Disp: 10 tablet, Rfl: 0   ondansetron  (ZOFRAN  ODT) 4 MG disintegrating tablet, Take 1 tablet (4 mg total) by mouth every 8 (eight) hours as needed for nausea., Disp: 10 tablet, Rfl: 0   Medications ordered in this encounter:  Meds ordered this encounter  Medications   albuterol  (VENTOLIN  HFA) 108 (90 Base) MCG/ACT inhaler    Sig: Inhale 1-2 puffs into the lungs every 6 (six) hours as needed for wheezing or shortness of breath.    Dispense:  18 g    Refill:  1     *If you need refills on other medications prior to your next appointment, please contact your  pharmacy*  Follow-Up: Call back or seek an in-person evaluation if the symptoms worsen or if the condition fails to improve as anticipated.  [facility] [phone]  Other Instructions Asthma, Adult  Asthma is a long-term (chronic) condition that causes recurrent episodes in which the lower airways in the lungs become tight and narrow. The narrowing is caused by inflammation and tightening of the smooth muscle around the lower airways. Asthma episodes, also called asthma attacks or asthma flares, may cause coughing, making high-pitched whistling sounds when you breathe, most often when you breathe out (wheezing), shortness of breath, and chest pain. The airways may produce extra mucus caused by the inflammation and irritation. During an attack, it can be difficult to breathe. Asthma attacks can range from minor to life-threatening. Asthma cannot be cured, but medicines and lifestyle changes can help control it and treat acute attacks. It is important to keep your asthma well controlled so the condition does not interfere with your daily life. What are the causes? This condition is believed to be caused by inherited (genetic) and environmental factors, but its exact cause is not known. What can trigger an asthma attack? Many things can bring on an asthma attack or make symptoms worse. These triggers are different for every person. Common triggers include: Allergens and irritants like mold, dust, pet dander, cockroaches, pollen, air pollution, and chemical odors. Cigarette smoke. Weather changes and cold air. Stress and strong  emotional responses such as crying or laughing hard. Certain medications such as aspirin  or beta blockers. Infections and inflammatory conditions, such as the flu, a cold, pneumonia, or inflammation of the nasal membranes (rhinitis). Gastroesophageal reflux disease (GERD). What are the signs or symptoms? Symptoms may occur right after exposure to an  asthma trigger or hours later and can vary by person. Common signs and symptoms include: Wheezing. Trouble breathing (shortness of breath). Excessive nighttime or early morning coughing. Chest tightness. Tiredness (fatigue) with minimal activity. Difficulty talking in complete sentences. Poor exercise tolerance. How is this diagnosed? This condition is diagnosed based on: A physical exam and your medical history. Tests, which may include: Lung function studies to evaluate the flow of air in your lungs. Allergy tests. Imaging tests, such as X-rays. How is this treated? There is no cure, but symptoms can be controlled with proper treatment. Treatment usually involves: Identifying and avoiding your asthma triggers. Inhaled medicines. Two types are commonly used to treat asthma, depending on severity: Controller medicines. These help prevent asthma symptoms from occurring. They are taken every day. Fast-acting reliever or rescue medicines. These quickly relieve asthma symptoms. They are used as needed and provide short-term relief. Using other medicines, such as: Allergy medicines, such as antihistamines, if your asthma attacks are triggered by allergens. Immune medicines (immunomodulators). These are medicines that help control the immune system. Using supplemental oxygen. This is only needed during a severe episode. Creating an asthma action plan. An asthma action plan is a written plan for managing and treating your asthma attacks. This plan includes: A list of your asthma triggers and how to avoid them. Information about when medicines should be taken and when their dosage should be changed. Instructions about using a device called a peak flow meter. A peak flow meter measures how well the lungs are working and the severity of your asthma. It helps you monitor your condition. Follow these instructions at home: Take over-the-counter and prescription medicines only as told by your health  care provider. Stay up to date on all vaccinations as recommended by your healthcare provider, including vaccines for the flu and pneumonia. Use a peak flow meter and keep track of your peak flow readings. Understand and use your asthma action plan to address any asthma flares. Do not smoke or allow anyone to smoke in your home. Contact a health care provider if: You have wheezing, shortness of breath, or a cough that is not responding to medicines. Your medicines are causing side effects, such as a rash, itching, swelling, or trouble breathing. You need to use a reliever medicine more than 2-3 times a week. Your peak flow reading is still at 50-79% of your personal best after following your action plan for 1 hour. You have a fever and shortness of breath. Get help right away if: You are getting worse and do not respond to treatment during an asthma attack. You are short of breath when at rest or when doing very little physical activity. You have difficulty eating, drinking, or talking. You have chest pain or tightness. You develop a fast heartbeat or palpitations. You have a bluish color to your lips or fingernails. You are light-headed or dizzy, or you faint. Your peak flow reading is less than 50% of your personal best. You feel too tired to breathe normally. These symptoms may be an emergency. Get help right away. Call 911. Do not wait to see if the symptoms will go away. Do not drive yourself to the  hospital. Summary Asthma is a long-term (chronic) condition that causes recurrent episodes in which the airways become tight and narrow. Asthma episodes, also called asthma attacks or asthma flares, can cause coughing, wheezing, shortness of breath, and chest pain. Asthma cannot be cured, but medicines and lifestyle changes can help keep it well controlled and prevent asthma flares. Make sure you understand how to avoid triggers and how and when to use your medicines. Asthma attacks can  range from minor to life-threatening. Get help right away if you have an asthma attack and do not respond to treatment with your usual rescue medicines. This information is not intended to replace advice given to you by your health care provider. Make sure you discuss any questions you have with your health care provider. Document Revised: 08/31/2021 Document Reviewed: 08/22/2021 Elsevier Patient Education  2024 [employer]   If you have been instructed to have an in-person evaluation today at a local Urgent Care facility, please use the link below. It will take you to a list of all of our available [facility], including address, phone number and hours of operation. Please do not delay care.  [facility]  If you or a family member do not have a primary care provider, use the link below to schedule a visit and establish care. When you choose a [facility] primary care physician or advanced practice provider, you gain a long-term partner in health. Find a Primary Care Provider  Learn more about [facility]'s in-office and virtual care options: [facility] - Get Care Now

## 2024-03-22 NOTE — Progress Notes (Signed)
 Virtual Visit Consent   Nicholas Braun, you are scheduled for a virtual visit with a [facility] provider today. Just as with appointments in the office, your consent must be obtained to participate. Your consent will be active for this visit and any virtual visit you may have with one of our providers in the next 365 days. If you have a MyChart account, a copy of this consent can be sent to you electronically.  As this is a virtual visit, video technology does not allow for your provider to perform a traditional examination. This may limit your provider's ability to fully assess your condition. If your provider identifies any concerns that need to be evaluated in person or the need to arrange testing (such as labs, EKG, etc.), we will make arrangements to do so. Although advances in technology are sophisticated, we cannot ensure that it will always work on either your end or our end. If the connection with a video visit is poor, the visit may have to be switched to a telephone visit. With either a video or telephone visit, we are not always able to ensure that we have a secure connection.  By engaging in this virtual visit, you consent to the provision of healthcare and authorize for your insurance to be billed (if applicable) for the services provided during this visit. Depending on your insurance coverage, you may receive a charge related to this service.  I need to obtain your verbal consent now. Are you willing to proceed with your visit today? Christapher Welser has provided verbal consent on 03/22/2024 for a virtual visit (video or telephone). Nicholas Braun, [place]  Date: 03/22/2024 3:26 PM   Virtual Visit via Video Note   I, Nicholas Braun, connected with  Kerrie Murie  ([id number], [birth year]) on 03/22/24 at  3:15 PM EDT by a video-enabled telemedicine application and verified that I am speaking with the correct person using two identifiers.  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 The patient expressed understanding and agreed to proceed.    History of Present Illness: Nicholas Braun is a 38 y.o. who identifies as a male who was assigned male at birth, and is being seen today for c/o running out of prescription of Albuterol  inhaler.  Pt states he has switched jobs and does not have insurance yet.  Pt is requesting inhaler refill.  HPI: HPI  Problems: There are no active problems to display for this patient.   Allergies:  Allergies  Allergen Reactions   Food Hives, Swelling and Other (See Comments)    Pt states that he is allergic to ranch dressing.     Medications:  Current Outpatient Medications:    albuterol  (VENTOLIN  HFA) 108 (90 Base) MCG/ACT inhaler, Inhale 1-2 puffs into the lungs every 6 (six) hours as needed for wheezing or shortness of breath., Disp: 18 g, Rfl: 1   dicyclomine  (BENTYL ) 20 MG tablet, Take 1 tablet (20 mg total) by mouth 2 (two) times daily., Disp: 20 tablet, Rfl: 0   loperamide (IMODIUM A-D) 2 MG tablet, Take 2 mg by mouth 4 (four) times daily as needed for diarrhea or loose stools., Disp: , Rfl:    loratadine  (CLARITIN ) 10 MG tablet, Take 10 mg by mouth daily as needed for allergies., Disp: , Rfl:    naproxen  (NAPROSYN ) 500  MG tablet, Take 1 tablet (500 mg total) by mouth 2 (two) times daily., Disp: 10 tablet, Rfl: 0   ondansetron  (ZOFRAN  ODT) 4 MG disintegrating tablet, Take 1 tablet (4 mg total) by mouth every 8 (eight) hours as needed for nausea., Disp: 10 tablet, Rfl: 0  Observations/Objective: Patient is well-developed, well-nourished in no acute distress.  Resting comfortably in parked car Head is normocephalic, atraumatic.  No labored breathing. Speech is clear and coherent with logical content.  Patient is alert and  oriented at baseline.   Assessment and Plan: 1. Asthma, unspecified asthma severity, unspecified whether complicated, unspecified whether persistent (Primary) - albuterol  (VENTOLIN  HFA) 108 (90 Base) MCG/ACT inhaler; Inhale 1-2 puffs into the lungs every 6 (six) hours as needed for wheezing or shortness of breath.  Dispense: 18 g; Refill: 1  -Medication refills today -Pt was advised to follow up with PCP for further refills and Asthma management.   Follow Up Instructions: I discussed the assessment and treatment plan with the patient. The patient was provided an opportunity to ask questions and all were answered. The patient agreed with the plan and demonstrated an understanding of the instructions.  A copy of instructions were sent to the patient via MyChart unless otherwise noted below.    The patient was advised to call back or seek an in-person evaluation if the symptoms worsen or if the condition fails to improve as anticipated.    Nicholas Roys, PA-C

## 2024-07-26 ENCOUNTER — Telehealth: Payer: Self-pay | Admitting: Emergency Medicine

## 2024-07-26 DIAGNOSIS — J45909 Unspecified asthma, uncomplicated: Secondary | ICD-10-CM

## 2024-07-26 MED ORDER — ALBUTEROL SULFATE HFA 108 (90 BASE) MCG/ACT IN AERS: 1.0000 | INHALATION_SPRAY | Freq: Four times a day (QID) | RESPIRATORY_TRACT | 1 refills | Status: AC | PRN

## 2024-07-26 NOTE — Patient Instructions (Signed)
  Kandi Burnard Sprang, thank you for joining Jon CHRISTELLA Belt, NP for today's virtual visit.  While this provider is not your primary care provider (PCP), if your PCP is located in our provider database this encounter information will be shared with them immediately following your visit.   A [facility] MyChart account gives you access to today's visit and all your visits, tests, and labs performed at [facility]  click here if you don't have a [facility] MyChart account or go to [email or web address]  Consent: (Patient) Conor Lata provided verbal consent for this virtual visit at the beginning of the encounter.  Current Medications:  Current Outpatient Medications:    albuterol  (VENTOLIN  HFA) 108 (90 Base) MCG/ACT inhaler, Inhale 1-2 puffs into the lungs every 6 (six) hours as needed for wheezing or shortness of breath., Disp: 18 g, Rfl: 1   dicyclomine  (BENTYL ) 20 MG tablet, Take 1 tablet (20 mg total) by mouth 2 (two) times daily., Disp: 20 tablet, Rfl: 0   loperamide (IMODIUM A-D) 2 MG tablet, Take 2 mg by mouth 4 (four) times daily as needed for diarrhea or loose stools., Disp: , Rfl:    loratadine  (CLARITIN ) 10 MG tablet, Take 10 mg by mouth daily as needed for allergies., Disp: , Rfl:    naproxen  (NAPROSYN ) 500 MG tablet, Take 1 tablet (500 mg total) by mouth 2 (two) times daily., Disp: 10 tablet, Rfl: 0   ondansetron  (ZOFRAN  ODT) 4 MG disintegrating tablet, Take 1 tablet (4 mg total) by mouth every 8 (eight) hours as needed for nausea., Disp: 10 tablet, Rfl: 0   Medications ordered in this encounter:  Meds ordered this encounter  Medications   albuterol  (VENTOLIN  HFA) 108 (90 Base) MCG/ACT inhaler    Sig: Inhale 1-2 puffs into the lungs every 6 (six) hours as needed for wheezing or shortness of breath.    Dispense:  18 g    Refill:  1     *If you need refills on other medications prior to your next appointment, please contact your  pharmacy*  Follow-Up: Call back or seek an in-person evaluation if the symptoms worsen or if the condition fails to improve as anticipated.  [facility] [phone]  Other Instructions When you are able to, once you have insurance again, please get established with a primary care provider who can discuss best options for keeping your asthma under control. If you do not have a primary care provider, and you want to find one at [facility], go to [email or web address], click on primary care, click on new patients: schedule online, choose internal medicine or family medicine, and choose an appointment location and time that fits your schedule.     If you have been instructed to have an in-person evaluation today at a local Urgent Care facility, please use the link below. It will take you to a list of all of our available [facility], including address, phone number and hours of operation. Please do not delay care.  [facility]  If you or a family member do not have a primary care provider, use the link below to schedule a visit and establish care. When you choose a [facility] primary care physician or advanced practice provider, you gain a long-term partner in health. Find a Primary Care Provider  Learn more about [facility]'s in-office and virtual care options: [facility] - Get Care Now

## 2024-07-26 NOTE — Progress Notes (Signed)
 Virtual Visit Consent   Nicholas Braun, you are scheduled for a virtual visit with a [facility] provider today. Just as with appointments in the office, your consent must be obtained to participate. Your consent will be active for this visit and any virtual visit you may have with one of our providers in the next 365 days. If you have a MyChart account, a copy of this consent can be sent to you electronically.  As this is a virtual visit, video technology does not allow for your provider to perform a traditional examination. This may limit your provider's ability to fully assess your condition. If your provider identifies any concerns that need to be evaluated in person or the need to arrange testing (such as labs, EKG, etc.), we will make arrangements to do so. Although advances in technology are sophisticated, we cannot ensure that it will always work on either your end or our end. If the connection with a video visit is poor, the visit may have to be switched to a telephone visit. With either a video or telephone visit, we are not always able to ensure that we have a secure connection.  By engaging in this virtual visit, you consent to the provision of healthcare and authorize for your insurance to be billed (if applicable) for the services provided during this visit. Depending on your insurance coverage, you may receive a charge related to this service.  I need to obtain your verbal consent now. Are you willing to proceed with your visit today? Nicholas Braun has provided verbal consent on 07/26/2024 for a virtual visit (video or telephone). Jon CHRISTELLA Belt, NP  Date: 07/26/2024 4:15 PM   Virtual Visit via Video Note   I, Jon CHRISTELLA Belt, connected with  Luis Nickles  ([id number], 1985-12-21) on 07/26/24 at  4:15 PM EDT by a video-enabled telemedicine application and verified that I am speaking with the correct person using two identifiers.  Location: Patient: Virtual Visit  Location Patient: Other: parked car in [place] Provider: Virtual Visit Location Provider: Home Office   I discussed the limitations of evaluation and management by telemedicine and the availability of in person appointments. The patient expressed understanding and agreed to proceed.    History of Present Illness: Nicholas Braun is a 38 y.o. who identifies as a male who was assigned male at birth, and is being seen today for asthma flare due to season change. Doesn't have insurance with new job until October, needs help getting an inhaler as he is almost out of albuterol . Using inhaler right now frequently. Has not used a prevention steroid inhaler in years because sometimes he doesn't need to use albuterol  very much, most just during changes of seasons  HPI: HPI  Problems: There are no active problems to display for this patient.   Allergies:  Allergies  Allergen Reactions   Food Hives, Swelling and Other (See Comments)    Pt states that he is allergic to ranch dressing.     Medications:  Current Outpatient Medications:    albuterol  (VENTOLIN  HFA) 108 (90 Base) MCG/ACT inhaler, Inhale 1-2 puffs into the lungs every 6 (six) hours as needed for wheezing or shortness of breath., Disp: 18 g, Rfl: 1   dicyclomine  (BENTYL ) 20 MG tablet, Take 1 tablet (20 mg total) by mouth 2 (two) times daily., Disp: 20 tablet, Rfl: 0   loperamide (IMODIUM A-D) 2 MG tablet, Take 2 mg by mouth 4 (four) times daily as needed for  diarrhea or loose stools., Disp: , Rfl:    loratadine  (CLARITIN ) 10 MG tablet, Take 10 mg by mouth daily as needed for allergies., Disp: , Rfl:    naproxen  (NAPROSYN ) 500 MG tablet, Take 1 tablet (500 mg total) by mouth 2 (two) times daily., Disp: 10 tablet, Rfl: 0   ondansetron  (ZOFRAN  ODT) 4 MG disintegrating tablet, Take 1 tablet (4 mg total) by mouth every 8 (eight) hours as needed for nausea., Disp: 10 tablet, Rfl: 0  Observations/Objective: Patient is well-developed, well-nourished  in no acute distress.  Resting comfortably  at in parked car in [place].  Head is normocephalic, atraumatic.  No labored breathing.  Speech is clear and coherent with logical content.  Patient is alert and oriented at baseline.    Assessment and Plan: 1. Asthma, unspecified asthma severity, unspecified whether complicated, unspecified whether persistent - albuterol  (VENTOLIN  HFA) 108 (90 Base) MCG/ACT inhaler; Inhale 1-2 puffs into the lungs every 6 (six) hours as needed for wheezing or shortness of breath.  Dispense: 18 g; Refill: 1  Advised establishing with pcp once he has insurance again and consdider preventive inhalers/medicine for his ashtma at season changes  Follow Up Instructions: I discussed the assessment and treatment plan with the patient. The patient was provided an opportunity to ask questions and all were answered. The patient agreed with the plan and demonstrated an understanding of the instructions.  A copy of instructions were sent to the patient via MyChart unless otherwise noted below.   The patient was advised to call back or seek an in-person evaluation if the symptoms worsen or if the condition fails to improve as anticipated.    Jon CHRISTELLA Belt, NP
